# Patient Record
Sex: Male | Born: 1970 | Race: White | Hispanic: No | Marital: Single | Smoking: Current every day smoker
Health system: Southern US, Community
[De-identification: ages and names within clinical notes are randomized; demographics above are authoritative.]

## PROBLEM LIST (undated history)

## (undated) DIAGNOSIS — K219 Gastro-esophageal reflux disease without esophagitis: Secondary | ICD-10-CM

## (undated) DIAGNOSIS — F1910 Other psychoactive substance abuse, uncomplicated: Secondary | ICD-10-CM

## (undated) DIAGNOSIS — F1010 Alcohol abuse, uncomplicated: Secondary | ICD-10-CM

## (undated) DIAGNOSIS — F419 Anxiety disorder, unspecified: Secondary | ICD-10-CM

## (undated) HISTORY — DX: Other psychoactive substance abuse, uncomplicated: F19.10

## (undated) HISTORY — DX: Alcohol abuse, uncomplicated: F10.10

## (undated) HISTORY — DX: Anxiety disorder, unspecified: F41.9

---

## 2001-03-02 ENCOUNTER — Emergency Department (HOSPITAL_COMMUNITY): Admission: EM | Admit: 2001-03-02 | Discharge: 2001-03-02 | Payer: Self-pay | Admitting: Emergency Medicine

## 2001-03-02 ENCOUNTER — Encounter: Payer: Self-pay | Admitting: Emergency Medicine

## 2002-02-16 ENCOUNTER — Emergency Department (HOSPITAL_COMMUNITY): Admission: EM | Admit: 2002-02-16 | Discharge: 2002-02-16 | Payer: Self-pay | Admitting: Emergency Medicine

## 2003-09-15 ENCOUNTER — Emergency Department (HOSPITAL_COMMUNITY): Admission: EM | Admit: 2003-09-15 | Discharge: 2003-09-15 | Payer: Self-pay | Admitting: Emergency Medicine

## 2004-08-17 ENCOUNTER — Emergency Department (HOSPITAL_COMMUNITY): Admission: EM | Admit: 2004-08-17 | Discharge: 2004-08-17 | Payer: Self-pay | Admitting: Emergency Medicine

## 2005-03-22 ENCOUNTER — Emergency Department (HOSPITAL_COMMUNITY): Admission: EM | Admit: 2005-03-22 | Discharge: 2005-03-22 | Payer: Self-pay | Admitting: Emergency Medicine

## 2005-09-21 ENCOUNTER — Emergency Department (HOSPITAL_COMMUNITY): Admission: EM | Admit: 2005-09-21 | Discharge: 2005-09-21 | Payer: Self-pay | Admitting: *Deleted

## 2006-10-29 HISTORY — PX: MULTIPLE TOOTH EXTRACTIONS: SHX2053

## 2009-03-03 ENCOUNTER — Ambulatory Visit: Payer: Self-pay | Admitting: Internal Medicine

## 2009-03-18 ENCOUNTER — Ambulatory Visit: Payer: Self-pay | Admitting: Internal Medicine

## 2010-11-20 ENCOUNTER — Encounter: Payer: Self-pay | Admitting: Internal Medicine

## 2011-05-11 ENCOUNTER — Encounter: Payer: Self-pay | Admitting: Internal Medicine

## 2016-05-31 ENCOUNTER — Emergency Department (HOSPITAL_COMMUNITY)
Admission: EM | Admit: 2016-05-31 | Discharge: 2016-05-31 | Disposition: A | Discharge status: Home / Self Care | Payer: Self-pay | Attending: Emergency Medicine | Admitting: Emergency Medicine

## 2016-05-31 ENCOUNTER — Telehealth: Payer: Self-pay | Admitting: General Practice

## 2016-05-31 ENCOUNTER — Encounter (HOSPITAL_COMMUNITY): Payer: Self-pay | Admitting: Emergency Medicine

## 2016-05-31 DIAGNOSIS — M549 Dorsalgia, unspecified: Secondary | ICD-10-CM | POA: Insufficient documentation

## 2016-05-31 DIAGNOSIS — R1013 Epigastric pain: Secondary | ICD-10-CM | POA: Insufficient documentation

## 2016-05-31 LAB — CBC
HCT: 39.2 % (ref 39.0–52.0)
HEMOGLOBIN: 13.2 g/dL (ref 13.0–17.0)
MCH: 29.5 pg (ref 26.0–34.0)
MCHC: 33.7 g/dL (ref 30.0–36.0)
MCV: 87.7 fL (ref 78.0–100.0)
PLATELETS: 265 10*3/uL (ref 150–400)
RBC: 4.47 MIL/uL (ref 4.22–5.81)
RDW: 14 % (ref 11.5–15.5)
WBC: 10.2 10*3/uL (ref 4.0–10.5)

## 2016-05-31 LAB — COMPREHENSIVE METABOLIC PANEL
ALK PHOS: 47 U/L (ref 38–126)
ALT: 21 U/L (ref 17–63)
ANION GAP: 8 (ref 5–15)
AST: 26 U/L (ref 15–41)
Albumin: 4.5 g/dL (ref 3.5–5.0)
BUN: 8 mg/dL (ref 6–20)
CALCIUM: 9.2 mg/dL (ref 8.9–10.3)
CO2: 27 mmol/L (ref 22–32)
CREATININE: 1.09 mg/dL (ref 0.61–1.24)
Chloride: 103 mmol/L (ref 101–111)
Glucose, Bld: 181 mg/dL — ABNORMAL HIGH (ref 65–99)
Potassium: 4.3 mmol/L (ref 3.5–5.1)
SODIUM: 138 mmol/L (ref 135–145)
TOTAL PROTEIN: 7.7 g/dL (ref 6.5–8.1)
Total Bilirubin: 0.4 mg/dL (ref 0.3–1.2)

## 2016-05-31 LAB — LIPASE, BLOOD: Lipase: 18 U/L (ref 11–51)

## 2016-05-31 LAB — URINALYSIS, ROUTINE W REFLEX MICROSCOPIC
BILIRUBIN URINE: NEGATIVE
Glucose, UA: NEGATIVE mg/dL
HGB URINE DIPSTICK: NEGATIVE
KETONES UR: NEGATIVE mg/dL
Leukocytes, UA: NEGATIVE
NITRITE: NEGATIVE
PH: 7 (ref 5.0–8.0)
Protein, ur: NEGATIVE mg/dL
SPECIFIC GRAVITY, URINE: 1.015 (ref 1.005–1.030)

## 2016-05-31 LAB — ETHANOL: Alcohol, Ethyl (B): <5 mg/dL (ref <5)

## 2016-05-31 MED ORDER — OMEPRAZOLE 20 MG PO CPDR: 20.0000 mg | DELAYED_RELEASE_CAPSULE | Freq: Every day | ORAL | 0 refills | Status: DC

## 2016-05-31 MED ORDER — SODIUM CHLORIDE 0.9 % IV BOLUS (SEPSIS)
1000.0000 mL | Freq: Once | INTRAVENOUS | Status: AC
  Administered 2016-05-31: 1000 mL via INTRAVENOUS

## 2016-05-31 MED ORDER — SUCRALFATE 1 GM/10ML PO SUSP
1.0000 g | Freq: Once | ORAL | Status: AC
  Administered 2016-05-31: 1 g via ORAL
  Filled 2016-05-31: qty 10

## 2016-05-31 MED ORDER — SUCRALFATE 1 GM/10ML PO SUSP: 1.0000 g | Freq: Three times a day (TID) | ORAL | 0 refills | Status: DC

## 2016-05-31 MED ORDER — FAMOTIDINE IN NACL 20-0.9 MG/50ML-% IV SOLN
20.0000 mg | Freq: Once | INTRAVENOUS | Status: AC
  Administered 2016-05-31: 20 mg via INTRAVENOUS
  Filled 2016-05-31: qty 50

## 2016-05-31 MED ORDER — GI COCKTAIL ~~LOC~~
30.0000 mL | Freq: Once | ORAL | Status: AC
  Administered 2016-05-31: 30 mL via ORAL
  Filled 2016-05-31: qty 30

## 2016-05-31 NOTE — Telephone Encounter (Signed)
Patient was seen in the ED for epigastric pain and was told by the ED doctor to make an appointment here to see a gastroenterologist for possible upper endoscopy  He is self-pay and will be paying cash for his visits

## 2016-05-31 NOTE — Discharge Instructions (Signed)
As discussed, and all those today's appointment has been reassuring, it is important that you follow up with our gastroenterologist.  Return here for concerning changes in your condition.

## 2016-05-31 NOTE — ED Provider Notes (Signed)
[facility]EM[facility] DEPT Provider Note   CSN: [id number] Arrival date & time: 05/31/16  0901  First Provider Contact:   First MD Initiated Contact with Patient 05/31/16 0908     By signing my name below, I, Majel Homer, attest that this documentation has been prepared under the direction and in the presence of Gerhard Munch, MD . Electronically Signed: Majel Homer, Scribe. 05/31/2016. 9:23 AM.  History   Chief Complaint Chief Complaint  Patient presents with  . Abdominal Pain   The history is provided by the patient. No language interpreter was used.   HPI Comments: Bradley Waller is a 45 y.o. male with PMHx of drug and alcogol abuse, who presents to the Emergency Department complaining of gradually worsening, 9/10, "sharp," mid-abdominal pain that began at ~2:00 AM this morning. He notes his pain is radiating towards the bilateral sides of his abdomen and into his lower back. He states he took Maalox and Zantac this morning with no relief. Pt reports a hx of gastric ulcers ~2 years ago but notes he has not followed-up with a gastroenterologist since then. He denies nausea, vomiting, dysuria and diarrhea. Pt states he smokes 1 pack a day.   Past Medical History:  Diagnosis Date  . Alcohol abuse   . Anxiety   . Drug abuse    There are no active problems to display for this patient.  No past surgical history on file.  Home Medications    Prior to Admission medications   Not on File   Family History No family history on file.  Social History Social History  Substance Use Topics  . Smoking status: Not on file  . Smokeless tobacco: Not on file  . Alcohol use Not on file   Allergies   Review of patient's allergies indicates no known allergies.   Review of Systems Review of Systems  Gastrointestinal: Positive for abdominal pain. Negative for diarrhea, nausea and vomiting.  Genitourinary: Negative for dysuria.  Musculoskeletal: Positive for back pain.  All other systems  reviewed and are negative.  Physical Exam Updated Vital Signs BP (!) 131/107 (BP Location: Right Arm)   Pulse 74   Temp 97.5 F (36.4 C) (Oral)   Resp 18   Ht 5\' 11"  (1.803 m)   Wt 205 lb (93 kg)   SpO2 100%   BMI 28.59 kg/m   Physical Exam  Constitutional: He is oriented to person, place, and time. He appears well-developed and well-nourished.  HENT:  Head: Normocephalic and atraumatic.  Eyes: Conjunctivae are normal. Pupils are equal, round, and reactive to light. Right eye exhibits no discharge. Left eye exhibits no discharge. No scleral icterus.  Neck: Normal range of motion. No JVD present. No tracheal deviation present.  Pulmonary/Chest: Effort normal. No stridor.  Neurological: He is alert and oriented to person, place, and time. Coordination normal.  Psychiatric: He has a normal mood and affect. His behavior is normal. Judgment and thought content normal.  Nursing note and vitals reviewed.  ED Treatments / Results  Labs (all labs ordered are listed, but only abnormal results are displayed) Labs Reviewed  COMPREHENSIVE METABOLIC PANEL - Abnormal; Notable for the following:       Result Value   Glucose, Bld 181 (*)    All other components within normal limits  LIPASE, BLOOD  CBC  URINALYSIS, ROUTINE W REFLEX MICROSCOPIC (NOT AT [facility])  ETHANOL     Procedures Procedures  DIAGNOSTIC STUDIES:  Oxygen Saturation is 100% on RA, normal by  my interpretation.    COORDINATION OF CARE:  9:20 AM Discussed treatment plan with pt at bedside and pt agreed to plan.   Medications Ordered in ED  GI cocktail, fluids, Carafate  Initial Impression / Assessment and Plan / ED Course  I have reviewed the triage vital signs and the nursing notes.  Pertinent labs & imaging results that were available during my care of the patient were reviewed by me and considered in my medical decision making (see chart for details).  Clinical Course  Comment By Time  Minimal  improvement. Initial labs reviewed Gerhard Munch, MD 08/03 1045    11:44 AM Symptoms have resolved. I discussed all findings patient, and with his resolution of symptoms, he was discharged in stable condition to follow-up with GI.  I personally performed the services described in this documentation, which was scribed in my presence. The recorded information has been reviewed and is accurate.   Final Clinical Impressions(s) / ED Diagnoses  Patient presents with ongoing abdominal pain, nausea. Patient has history of similar pain for some time, and today seems to be experiencing an exacerbation. Here, no evidence for acute pancreatitis, hepatobiliary dysfunction. Patient required fluid resuscitation, multiple attempts at medication relief, though his symptoms did resolve eventually. Patient encouraged to take all medication as directed, follow-up with gastroenterology, for possible endoscopy.   Gerhard Munch, MD 05/31/16 1146

## 2016-05-31 NOTE — Telephone Encounter (Signed)
Patient is scheduled to see Bradley Waller tomorrow at 8 am

## 2016-05-31 NOTE — ED Triage Notes (Signed)
Pt c/o generalized abdominal pain that began around 0200 this morning. Reports hx of stomach ulcer. States he took Maalox and Zantac with no relief. Pt also c/o urinary rentention and back pain. No v/d.

## 2016-06-01 ENCOUNTER — Encounter: Payer: Self-pay | Admitting: Gastroenterology

## 2016-06-01 ENCOUNTER — Other Ambulatory Visit: Payer: Self-pay

## 2016-06-01 ENCOUNTER — Ambulatory Visit (INDEPENDENT_AMBULATORY_CARE_PROVIDER_SITE_OTHER): Payer: Self-pay | Admitting: Gastroenterology

## 2016-06-01 DIAGNOSIS — R1013 Epigastric pain: Secondary | ICD-10-CM | POA: Insufficient documentation

## 2016-06-01 NOTE — Progress Notes (Signed)
No pcp per patient 

## 2016-06-01 NOTE — Assessment & Plan Note (Signed)
45 year old male with history of chronic episodic epigastric pain but now worsening and more frequent, persistent, in setting of chronic NSAIDs. Unable to exclude biliary etiology but less likely. Lipase, LFTs, CBC normal. No imaging. Abdominal exam benign. Will start with EGD first; may need further imaging in near future.   Proceed with upper endoscopy in the near future with Dr. Darrick Penna. The risks, benefits, and alternatives have been discussed in detail with patient. They have stated understanding and desire to proceed.  Phenergan 25 mg IV on call Dexilant samples provided with patient assistance forms Fill out [facility] assistance forms. Discussed with patient that he would need to fill this out; also discussed that if this did not cover, he would be responsible for completing a financial plan for payment. He states full understanding.

## 2016-06-01 NOTE — Progress Notes (Addendum)
REVIEWED-NO ADDITIONAL RECOMMENDATIONS.  Primary Care Physician:  No PCP Per Patient Primary Gastroenterologist:  Dr. Darrick Penna   Chief Complaint  Patient presents with  . Abdominal Pain    x 1-2 years    HPI:   Bradley Waller is a 45 y.o. male presenting today as a self-referral secondary to worsening dyspepsia.   Epigastric pain, radiates to LUQ, occasionally over to RUQ. Pretty sure he has an ulcer. States he wouldn't be surprised if he had an ulcer. No prior evaluation for this. First episode of pain 2-3 years ago and lasted about 2-3 hours. No problems for 6-8 months then happened again. Then another 8-9 months passed. This week has been nightly in occurrence, happening around 12pm, 1 am, "ridiculous" pain. Exacerbated by food but feels sore all the time. Described as sharp or stabbing, radiates into back. Lipase normal in the ED. LFTs normal. CBC normal. Gallbladder present. No imaging in the ED. Takes Ibuprofen routinely, taking due to headaches. No aspirin powders. No melena or hematochezia. No loss of appetite. No dysphagia. Started Omeprazole on 8/3. Prescribed Carafate on 8/3 as well. No current ETOH use. History of ETOH use but none in 5-6 years. Was on prescription Valium for history of anxiety but has not been on any for about 1 year (this was legally prescribed by a provider).   Past Medical History:  Diagnosis Date  . Alcohol abuse   . Anxiety   . Drug abuse   . Stomach ulcer     No past surgical history on file.  Current Outpatient Prescriptions  Medication Sig Dispense Refill  . acetaminophen (TYLENOL) 500 MG tablet Take 1,000 mg by mouth every 6 (six) hours as needed for moderate pain.    Marland Kitchen omeprazole (PRILOSEC) 20 MG capsule Take 1 capsule (20 mg total) by mouth daily. 21 capsule 0  . sucralfate (CARAFATE) 1 GM/10ML suspension Take 10 mLs (1 g total) by mouth 4 (four) times daily -  with meals and at bedtime. 420 mL 0   No current facility-administered  medications for this visit.     Allergies as of 06/01/2016  . (No Known Allergies)    Family History  Problem Relation Age of Onset  . Colon cancer Neg Hx     Social History   Social History  . Marital status: Single    Spouse name: N/A  . Number of children: N/A  . Years of education: N/A   Occupational History  . Not on file.   Social History Main Topics  . Smoking status: Current Every Day Smoker    Packs/day: 1.00    Types: Cigarettes  . Smokeless tobacco: Never Used  . Alcohol use No     Comment: history of ETOH abuse but none in 5-6 years.   . Drug use: No     Comment: was on prescription Valium, off for about 1 year.   . Sexual activity: Not on file   Other Topics Concern  . Not on file   Social History Narrative  . No narrative on file    Review of Systems: Gen: Denies any fever, chills, fatigue, weight loss, lack of appetite.  CV: Denies chest pain, heart palpitations, peripheral edema, syncope.  Resp: +SOB  GI: see HPI  GU : Denies urinary burning, urinary frequency, urinary hesitancy MS: Denies joint pain, muscle weakness, cramps, or limitation of movement.  Derm: Denies rash, itching, dry skin Psych: +anxiety  Heme: Denies bruising, bleeding, and enlarged  lymph nodes.  Physical Exam: BP (!) 158/102   Pulse 89   Temp 97.4 F (36.3 C) (Oral)   Ht  (1.803 m)   Wt 192 lb 12.8 oz (87.5 kg)   BMI 26.89 kg/m  General:   Alert and oriented. Pleasant and cooperative. Well-nourished and well-developed.  Head:  Normocephalic and atraumatic. Eyes:  Without icterus, sclera clear and conjunctiva pink.  Ears:  Normal auditory acuity. Nose:  No deformity, discharge,  or lesions. Lungs:  Clear to auscultation bilaterally. No wheezes, rales, or rhonchi. No distress.  Heart:  S1, S2 present without murmurs appreciated.  Abdomen:  +BS, soft, mild to moderate TTP epigastric region and non-distended. No HSM noted. No guarding or rebound. No masses  appreciated.  Rectal:  Deferred  Extremities:  Without  edema. Neurologic:  Alert and  oriented x4;  grossly normal neurologically. Psych:  Alert and cooperative. Normal mood and affect.  Lab Results  Component Value Date   ALT 21 05/31/2016   AST 26 05/31/2016   ALKPHOS 47 05/31/2016   BILITOT 0.4 05/31/2016   Lab Results  Component Value Date   WBC 10.2 05/31/2016   HGB 13.2 05/31/2016   HCT 39.2 05/31/2016   MCV 87.7 05/31/2016   PLT 265 05/31/2016   Lab Results  Component Value Date   CREATININE 1.09 05/31/2016   BUN 8 05/31/2016   NA 138 05/31/2016   K 4.3 05/31/2016   CL 103 05/31/2016   CO2 27 05/31/2016   Lab Results  Component Value Date   LIPASE 18 05/31/2016

## 2016-06-01 NOTE — Patient Instructions (Signed)
Stop Prilosec. Start taking Dexilant once each morning. I have provided samples and patient assistance forms to fill out.   We have scheduled you for an upper endoscopy with Dr. Darrick Penna.   Please fill out the [facility] Assistance paperwork so that this will hopefully be covered. If not, this could be self pay.   If any worsening of symptoms, go to the emergency room.

## 2016-06-08 ENCOUNTER — Encounter (HOSPITAL_COMMUNITY): Admission: RE | Payer: Self-pay | Source: Ambulatory Visit

## 2016-06-08 ENCOUNTER — Ambulatory Visit (HOSPITAL_COMMUNITY): Admission: RE | Admit: 2016-06-08 | Payer: Self-pay | Source: Ambulatory Visit | Admitting: Gastroenterology

## 2016-06-08 SURGERY — EGD (ESOPHAGOGASTRODUODENOSCOPY)
Anesthesia: Moderate Sedation

## 2016-09-05 ENCOUNTER — Encounter: Payer: Self-pay | Admitting: Gastroenterology

## 2016-09-05 ENCOUNTER — Ambulatory Visit (INDEPENDENT_AMBULATORY_CARE_PROVIDER_SITE_OTHER): Payer: Self-pay | Admitting: Gastroenterology

## 2016-09-05 VITALS — BP 147/106 | HR 102 | Temp 98.1°F | Ht 71.0 in | Wt 185.0 lb

## 2016-09-05 DIAGNOSIS — R197 Diarrhea, unspecified: Secondary | ICD-10-CM

## 2016-09-05 DIAGNOSIS — R1010 Upper abdominal pain, unspecified: Secondary | ICD-10-CM

## 2016-09-05 MED ORDER — SUCRALFATE 1 GM/10ML PO SUSP: 1.0000 g | Freq: Three times a day (TID) | ORAL | 0 refills | Status: DC

## 2016-09-05 MED ORDER — PANTOPRAZOLE SODIUM 40 MG PO TBEC: 40.0000 mg | DELAYED_RELEASE_TABLET | Freq: Every day | ORAL | 3 refills | Status: DC

## 2016-09-05 NOTE — Patient Instructions (Signed)
I have sent in Protonix to take once each day, 30 minutes before breakfast. DON'T START THIS until after you go to lab and do the blood and breath test.  Please complete the stool samples and return to the lab when you go.   We have also ordered a CT scan to get done this week.   Please finish the [facility] Assistance paperwork as soon as you can.

## 2016-09-05 NOTE — Progress Notes (Addendum)
REVIEWED-NO ADDITIONAL RECOMMENDATIONS.  Referring Provider: No ref. provider found Primary Care Physician:  No PCP Per Patient  Primary GI: Dr. Darrick PennaFields    Chief Complaint  Patient presents with  . Abdominal Pain    pain in mid upper abd, cramping RUQ  . Diarrhea    watery, x 1 month, up to 5x/day    HPI:   Bradley Waller is a 45 y.o. male presenting today with a history of chronic abdominal pain and now with new onset diarrhea. Was seen in Aug 2017 and recommended EGD but this was cancelled as he started feeling better.   Has had diarrhea for about a month now. 3-4 times per day, sometimes up to 5. Watery. No recent antibiotics, no sick contacts, has well water. Pain located in epigastric region, waxes and wanes. Usually epigastric pain starts, will go away, and then RUQ discomfort will start.  Worst is located in epigastric. States he toughs it out, lasting about 12 hours. Pain is worsened with eating. Pain unrelated to loose stool. No NSAIDs. No PPI. No dysphagia. Just taking a probiotic. No rectal bleeding. Prior to this happening would have a BM once every 3 days. Notes a lot of heartburn. Occasional nausea, no vomiting.   Lost 7 lbs since visit in Aug 2017. Highest weight was 210 about a year ago. Labs in August at time of abdominal pain with normal lipase, LFTs, CBC.    Past Medical History:  Diagnosis Date  . Alcohol abuse   . Anxiety   . Drug abuse     No past surgical history on file.  Current Outpatient Prescriptions  Medication Sig Dispense Refill  . Probiotic Product (PROBIOTIC DAILY PO) Take by mouth daily. Accuflora once a day    . sucralfate (CARAFATE) 1 GM/10ML suspension Take 10 mLs (1 g total) by mouth 4 (four) times daily -  with meals and at bedtime. (Patient not taking: Reported on 09/05/2016) 420 mL 0   No current facility-administered medications for this visit.     Allergies as of 09/05/2016  . (No Known Allergies)    Family History  Problem  Relation Age of Onset  . Colon cancer Neg Hx     Social History   Social History  . Marital status: Single    Spouse name: N/A  . Number of children: N/A  . Years of education: N/A   Social History Main Topics  . Smoking status: Current Every Day Smoker    Packs/day: 1.00    Types: Cigarettes  . Smokeless tobacco: Never Used  . Alcohol use No     Comment: history of ETOH abuse but none in 5-6 years.   . Drug use: No     Comment: was on prescription Valium, off for about 1 year.   . Sexual activity: Not Asked   Other Topics Concern  . None   Social History Narrative  . None    Review of Systems: As mentioned in HPI   Physical Exam: BP (!) 147/106   Pulse (!) 102   Temp 98.1 F (36.7 C) (Oral)   Ht 5\' 11"  (1.803 m)   Wt 185 lb (83.9 kg)   BMI 25.80 kg/m  General:   Alert and oriented. No distress noted. Pleasant and cooperative.  Head:  Normocephalic and atraumatic. Eyes:  Conjuctiva clear without scleral icterus. Mouth:  Oral mucosa pink and moist. Good dentition. No lesions. Neck:  Supple, without mass or thyromegaly. Heart:  S1, S2  present without murmurs Abdomen:  +BS, soft, TTP epigastric and RUQ  and non-distended. No rebound or guarding. No HSM or masses noted. Msk:  Symmetrical without gross deformities. Normal posture. Extremities:  Without edema. Neurologic:  Alert and  oriented x4;  grossly normal neurologically. Psych:  Alert and cooperative. Normal mood and affect.

## 2016-09-06 ENCOUNTER — Other Ambulatory Visit (HOSPITAL_COMMUNITY)
Admission: RE | Admit: 2016-09-06 | Discharge: 2016-09-06 | Disposition: A | Discharge status: Home / Self Care | Payer: Self-pay | Source: Ambulatory Visit | Attending: Gastroenterology | Admitting: Gastroenterology

## 2016-09-06 ENCOUNTER — Ambulatory Visit (HOSPITAL_COMMUNITY)
Admission: RE | Admit: 2016-09-06 | Discharge: 2016-09-06 | Disposition: A | Discharge status: Home / Self Care | Payer: Self-pay | Source: Ambulatory Visit | Attending: Gastroenterology | Admitting: Gastroenterology

## 2016-09-06 ENCOUNTER — Encounter (HOSPITAL_COMMUNITY): Payer: Self-pay

## 2016-09-06 DIAGNOSIS — R1010 Upper abdominal pain, unspecified: Secondary | ICD-10-CM

## 2016-09-06 DIAGNOSIS — R197 Diarrhea, unspecified: Secondary | ICD-10-CM | POA: Insufficient documentation

## 2016-09-06 LAB — BASIC METABOLIC PANEL
Anion gap: 6 (ref 5–15)
BUN: 6 mg/dL (ref 6–20)
CALCIUM: 9.5 mg/dL (ref 8.9–10.3)
CHLORIDE: 103 mmol/L (ref 101–111)
CO2: 28 mmol/L (ref 22–32)
CREATININE: 0.99 mg/dL (ref 0.61–1.24)
GFR calc Af Amer: >60 mL/min (ref >60)
GFR calc non Af Amer: >60 mL/min (ref >60)
GLUCOSE: 89 mg/dL (ref 65–99)
Potassium: 4.7 mmol/L (ref 3.5–5.1)
Sodium: 137 mmol/L (ref 135–145)

## 2016-09-06 LAB — HEPATIC FUNCTION PANEL
ALT: 23 U/L (ref 17–63)
AST: 23 U/L (ref 15–41)
Albumin: 4.3 g/dL (ref 3.5–5.0)
Alkaline Phosphatase: 53 U/L (ref 38–126)
BILIRUBIN DIRECT: 0.1 mg/dL (ref 0.1–0.5)
BILIRUBIN INDIRECT: 0.3 mg/dL (ref 0.3–0.9)
TOTAL PROTEIN: 7.3 g/dL (ref 6.5–8.1)
Total Bilirubin: 0.4 mg/dL (ref 0.3–1.2)

## 2016-09-06 LAB — C DIFFICILE QUICK SCREEN W PCR REFLEX
C Diff antigen: NEGATIVE
C Diff interpretation: NOT DETECTED
C Diff toxin: NEGATIVE

## 2016-09-06 LAB — LIPASE, BLOOD: Lipase: 21 U/L (ref 11–51)

## 2016-09-06 MED ORDER — BARIUM SULFATE 2.1 % PO SUSP
ORAL | Status: AC
  Filled 2016-09-06: qty 2

## 2016-09-07 ENCOUNTER — Ambulatory Visit (HOSPITAL_COMMUNITY)
Admission: RE | Admit: 2016-09-07 | Discharge: 2016-09-07 | Disposition: A | Discharge status: Home / Self Care | Payer: Self-pay | Source: Ambulatory Visit | Attending: Gastroenterology | Admitting: Gastroenterology

## 2016-09-07 ENCOUNTER — Telehealth: Payer: Self-pay

## 2016-09-07 DIAGNOSIS — R1010 Upper abdominal pain, unspecified: Secondary | ICD-10-CM | POA: Insufficient documentation

## 2016-09-07 MED ORDER — IOPAMIDOL (ISOVUE-300) INJECTION 61%
100.0000 mL | Freq: Once | INTRAVENOUS | Status: AC | PRN
  Administered 2016-09-07: 100 mL via INTRAVENOUS

## 2016-09-07 NOTE — Telephone Encounter (Signed)
[facility]- pt came by there yesterday and had blood work done and turned in stool studies. They did not do the hpylori breath test, she said they do not do that test at the hospital lab. She was just calling to let us know.  Routing to [employer] and Anna.

## 2016-09-10 DIAGNOSIS — R197 Diarrhea, unspecified: Secondary | ICD-10-CM | POA: Insufficient documentation

## 2016-09-10 DIAGNOSIS — R1010 Upper abdominal pain, unspecified: Secondary | ICD-10-CM | POA: Insufficient documentation

## 2016-09-10 MED ORDER — DICYCLOMINE HCL 10 MG PO CAPS: 10.0000 mg | ORAL_CAPSULE | Freq: Three times a day (TID) | ORAL | 3 refills | Status: DC

## 2016-09-10 NOTE — Assessment & Plan Note (Signed)
New onset, unclear etiology. Does not appear to have any contact with sick individuals or exposure to antibiotics. Check stool studies now. Will send in Bentyl if negative. No rectal bleeding or indication for colonoscopy just yet. Will closely monitor.

## 2016-09-10 NOTE — Assessment & Plan Note (Signed)
45 year old male with chronic upper abdominal pain/RUQ pain, waxing and waning, without dysphagia but associated nausea. Worsened by eating. Weight loss noted of 7 lbs since visit in August. No PPI. Proceed with labs, H.pylori breath test, CT abd/pelvis. Start Protonix once daily after breath test completed. May need EGD. Gallbladder remains in situ; unable to exclude biliary etiology.

## 2016-09-10 NOTE — Progress Notes (Signed)
CT shows mild wall thickening of gallbladder. LFTs, CBC normal. Stool studies negative for Cdiff. I will send in Bentyl to take before meals and at bedtime. HE NEEDS an ultrasound of his gallbladder.

## 2016-09-10 NOTE — Telephone Encounter (Signed)
Can we see if patient has started on his PPI yet? If he has, will need to postpone H.pylori breath test. If not, [facility] to get completed. Also, I am follow-up on results that he had done. Will send a result note.

## 2016-09-11 ENCOUNTER — Other Ambulatory Visit: Payer: Self-pay

## 2016-09-11 DIAGNOSIS — R935 Abnormal findings on diagnostic imaging of other abdominal regions, including retroperitoneum: Secondary | ICD-10-CM

## 2016-09-11 DIAGNOSIS — R1010 Upper abdominal pain, unspecified: Secondary | ICD-10-CM

## 2016-09-11 LAB — STOOL CULTURE REFLEX - RSASHR

## 2016-09-11 LAB — STOOL CULTURE: E coli, Shiga toxin Assay: NEGATIVE

## 2016-09-11 LAB — STOOL CULTURE REFLEX - CMPCXR

## 2016-09-11 NOTE — Progress Notes (Signed)
LMOM to call.

## 2016-09-11 NOTE — Telephone Encounter (Signed)
LMOM to call.

## 2016-09-11 NOTE — Progress Notes (Signed)
Pt is aware of results and OK to schedule the US of gallbladder.

## 2016-09-11 NOTE — Progress Notes (Signed)
NO PCP PER PATIENT °

## 2016-09-13 ENCOUNTER — Ambulatory Visit (HOSPITAL_COMMUNITY): Admission: RE | Admit: 2016-09-13 | Payer: Self-pay | Source: Ambulatory Visit

## 2016-09-14 ENCOUNTER — Telehealth: Payer: Self-pay | Admitting: Gastroenterology

## 2016-09-14 ENCOUNTER — Ambulatory Visit (HOSPITAL_COMMUNITY)
Admission: RE | Admit: 2016-09-14 | Discharge: 2016-09-14 | Disposition: A | Discharge status: Home / Self Care | Payer: Self-pay | Source: Ambulatory Visit | Attending: Gastroenterology | Admitting: Gastroenterology

## 2016-09-14 DIAGNOSIS — R1010 Upper abdominal pain, unspecified: Secondary | ICD-10-CM | POA: Insufficient documentation

## 2016-09-14 DIAGNOSIS — K760 Fatty (change of) liver, not elsewhere classified: Secondary | ICD-10-CM | POA: Insufficient documentation

## 2016-09-14 DIAGNOSIS — R935 Abnormal findings on diagnostic imaging of other abdominal regions, including retroperitoneum: Secondary | ICD-10-CM | POA: Insufficient documentation

## 2016-09-14 LAB — GIARDIA/CRYPTOSPORIDIUM EIA
Cryptosporidium EIA: NEGATIVE
Giardia Ag, Stl: NEGATIVE

## 2016-09-14 NOTE — Telephone Encounter (Signed)
ATTEMPTED MULTIPLE TIMES TO CALL PT. PHONE NUMBER IS BUSY.

## 2016-09-14 NOTE — Telephone Encounter (Signed)
AGAIN ATTEMPTED TO CONTACT PT. PHONE AGAIN BUSY. SEND LETTER FOR PT TO CONTACT OFFICE FOR RESULTS.

## 2016-09-14 NOTE — Telephone Encounter (Signed)
Called BY RADIOLOGY US C/W CHOLECYSTITIS. DISCUSSED WITH DR. DAVIS. PLAN FOR SURGERY WITHIN THE NEXT 7-10 DAYS. WILL SEE PT IN [facility] AND SCHEDULE CHOLECYSTECTOMY.

## 2016-09-17 NOTE — Telephone Encounter (Signed)
Letter was mailed to pt this morning to call in reference to his US results.

## 2016-09-17 NOTE — Telephone Encounter (Signed)
Letter mailed

## 2016-09-19 NOTE — Telephone Encounter (Signed)
I tried to reach patient again. His aunt answered the phone. The number listed is his aunt. I explained to her that he needed to call our office as soon as he can, but that we will be closed Thursday and Friday. I asked her to have him call us first thing on Monday.   We need to get him an appt with Dr. Earlene Plateravis for evaluation next week. The number his aunt gave me was [phone]. I don't know if this is him, because I called and it was only a generic answering machine message.   Let's try to get up with him again first thing on Monday morning.

## 2016-09-24 ENCOUNTER — Telehealth: Payer: Self-pay | Admitting: Gastroenterology

## 2016-09-24 ENCOUNTER — Other Ambulatory Visit: Payer: Self-pay

## 2016-09-24 DIAGNOSIS — K8021 Calculus of gallbladder without cholecystitis with obstruction: Secondary | ICD-10-CM

## 2016-09-24 NOTE — Telephone Encounter (Signed)
Spoke to pt today about the referral to surgeon. He has started back on Protonix. He is aware he will need to be off of it for 14 days prior to doing the H Pylori test. He is aware [facility].  He said he wants to wait on it for awhile til after he sees the surgeon.

## 2016-09-24 NOTE — Telephone Encounter (Signed)
I called and told pt would be at this number and then she gave me two numbers, she couldn't read the number good. I called both [phone] and [phone] and left message with my name and where I was calling from. Told them to disregard if it was not Bradley Waller.

## 2016-09-24 NOTE — Telephone Encounter (Signed)
Referral has been faxed.

## 2016-09-24 NOTE — Telephone Encounter (Signed)
Ginger, please get this ASAP.

## 2016-09-24 NOTE — Telephone Encounter (Signed)
[phone] PATIENT RETURNED CALL FROM LAST WEEK INQUIRING ABOUT HIS CARE

## 2016-09-24 NOTE — Telephone Encounter (Signed)
I called pt's aunt back [facility]( [place]) and told her it is VERY IMPORTANT for pt to get in touch with us. He could not be reached at the numbers she gave me.  She will try to get in touch with him and have him call.

## 2016-09-24 NOTE — Telephone Encounter (Signed)
See addendum to the OV note of 09/14/2016. Pt is aware and ok to refer to Surgeon.  [facility].

## 2016-09-24 NOTE — Telephone Encounter (Signed)
Pt is aware and OK to refer to Dr. Earlene Plateravis. OK to call his aunt [place] @ [phone] and she will get in touch with him to call.

## 2016-09-24 NOTE — Telephone Encounter (Signed)
I agree, no need to do H.pylori test right now. His symptoms are coming from cholecystitis. He can hold off on that. Does he have an appt with Dr. Earlene Plateravis? If not, we need to get him in ASAP.

## 2016-09-26 NOTE — Patient Instructions (Signed)
Bradley ScoreRichard W Caillier  09/26/2016     @PREFPERIOPPHARMACY @   Your procedure is scheduled on 10/01/2016   [facility]  [facility]  [facility].M.  Call this number if you have problems the morning of surgery:  [phone]   Remember:  Do not eat food or drink liquids after midnight.  Take these medicines the morning of surgery with A SIP OF WATER  Protonix, bentyl.   Do not wear jewelry, make-up or nail polish.  Do not wear lotions, powders, or perfumes, or deoderant.  Do not shave 48 hours prior to surgery.  Men may shave face and neck.  Do not bring valuables to the hospital.  [facility] belongings or valuables.  Contacts, dentures or bridgework may not be worn into surgery.  Leave your suitcase in the car.  After surgery it may be brought to your room.  For patients admitted to the hospital, discharge time will be determined by your treatment team.  Patients discharged the day of surgery will not be allowed to drive home.   Name and phone number of your driver:   family Special instructions:  none  Please read over the following fact sheets that you were given. Anesthesia Post-op Instructions and Care and Recovery After Surgery       Laparoscopic Cholecystectomy Laparoscopic cholecystectomy is surgery to remove the gallbladder. The gallbladder is a pear-shaped organ that lies beneath the liver on the right side of the body. The gallbladder stores bile, which is a fluid that helps the body to digest fats. Cholecystectomy is often done for inflammation of the gallbladder (cholecystitis). This condition is usually caused by a buildup of gallstones (cholelithiasis) in the gallbladder. Gallstones can block the flow of bile, which can result in inflammation and pain. In severe cases, emergency surgery may be required. This procedure is done though small incisions in your abdomen (laparoscopic surgery). A thin scope with a camera  (laparoscope) is inserted through one incision. Thin surgical instruments are inserted through the other incisions. In some cases, a laparoscopic procedure may be turned into a type of surgery that is done through a larger incision (open surgery). Tell a health care provider about:  Any allergies you have.  All medicines you are taking, including vitamins, herbs, eye drops, creams, and over-the-counter medicines.  Any problems you or family members have had with anesthetic medicines.  Any blood disorders you have.  Any surgeries you have had.  Any medical conditions you have.  Whether you are pregnant or may be pregnant. What are the risks? Generally, this is a safe procedure. However, problems may occur, including:  Infection.  Bleeding.  Allergic reactions to medicines.  Damage to other structures or organs.  A stone remaining in the common bile duct. The common bile duct carries bile from the gallbladder into the small intestine.  A bile leak from the cyst duct that is clipped when your gallbladder is removed. What happens before the procedure? Staying hydrated  Follow instructions from your health care provider about hydration, which may include:  Up to 2 hours before the procedure - you may continue to drink clear liquids, such as water, clear fruit juice, black coffee, and plain tea. Eating and drinking restrictions  Follow instructions from your health care provider about eating and drinking, which may include:  8 hours before the procedure - stop eating heavy meals or foods such  as meat, fried foods, or fatty foods.  6 hours before the procedure - stop eating light meals or foods, such as toast or cereal.  6 hours before the procedure - stop drinking milk or drinks that contain milk.  2 hours before the procedure - stop drinking clear liquids. Medicines  Ask your health care provider about:  Changing or stopping your regular medicines. This is especially  important if you are taking diabetes medicines or blood thinners.  Taking medicines such as aspirin and ibuprofen. These medicines can thin your blood. Do not take these medicines before your procedure if your health care provider instructs you not to.  You may be given antibiotic medicine to help prevent infection. General instructions  Let your health care provider know if you develop a cold or an infection before surgery.  Plan to have someone take you home from the hospital or clinic.  Ask your health care provider how your surgical site will be marked or identified. What happens during the procedure?  To reduce your risk of infection:  Your health care team will wash or sanitize their hands.  Your skin will be washed with soap.  Hair may be removed from the surgical area.  An IV tube may be inserted into one of your veins.  You will be given one or more of the following:  A medicine to help you relax (sedative).  A medicine to make you fall asleep (general anesthetic).  A breathing tube will be placed in your mouth.  Your surgeon will make several small cuts (incisions) in your abdomen.  The laparoscope will be inserted through one of the small incisions. The camera on the laparoscope will send images to a TV screen (monitor) in the operating room. This lets your surgeon see inside your abdomen.  Air-like gas will be pumped into your abdomen. This will expand your abdomen to give the surgeon more room to perform the surgery.  Other tools that are needed for the procedure will be inserted through the other incisions. The gallbladder will be removed through one of the incisions.  Your common bile duct may be examined. If stones are found in the common bile duct, they may be removed.  After your gallbladder has been removed, the incisions will be closed with stitches (sutures), staples, or skin glue.  Your incisions may be covered with a bandage (dressing). The  procedure may vary among health care providers and hospitals. What happens after the procedure?  Your blood pressure, heart rate, breathing rate, and blood oxygen level will be monitored until the medicines you were given have worn off.  You will be given medicines as needed to control your pain.  Do not drive for 24 hours if you were given a sedative. This information is not intended to replace advice given to you by your health care provider. Make sure you discuss any questions you have with your health care provider. Document Released: 10/15/2005 Document Revised: 05/06/2016 Document Reviewed: 04/02/2016 Elsevier Interactive Patient Education  2017 [employer]  Laparoscopic Cholecystectomy, Care After This sheet gives you information about how to care for yourself after your procedure. Your doctor may also give you more specific instructions. If you have problems or questions, contact your doctor. Follow these instructions at home: Care for cuts from surgery (incisions)   Follow instructions from your doctor about how to take care of your cuts from surgery. Make sure you:  Wash your hands with soap and water before you change your  bandage (dressing). If you cannot use soap and water, use hand sanitizer.  Change your bandage as told by your doctor.  Leave stitches (sutures), skin glue, or skin tape (adhesive) strips in place. They may need to stay in place for 2 weeks or longer. If tape strips get loose and curl up, you may trim the loose edges. Do not remove tape strips completely unless your doctor says it is okay.  Do not take baths, swim, or use a hot tub until your doctor says it is okay. Ask your doctor if you can take showers. You may only be allowed to take sponge baths for bathing.  Check your surgical cut area every day for signs of infection. Check for:  More redness, swelling, or pain.  More fluid or blood.  Warmth.  Pus or a bad smell. Activity  Do not drive or  use heavy machinery while taking prescription pain medicine.  Do not lift anything that is heavier than 10 lb (4.5 kg) until your doctor says it is okay.  Do not play contact sports until your doctor says it is okay.  Do not drive for 24 hours if you were given a medicine to help you relax (sedative).  Rest as needed. Do not return to work or school until your doctor says it is okay. General instructions  Take over-the-counter and prescription medicines only as told by your doctor.  To prevent or treat constipation while you are taking prescription pain medicine, your doctor may recommend that you:  Drink enough fluid to keep your pee (urine) clear or pale yellow.  Take over-the-counter or prescription medicines.  Eat foods that are high in fiber, such as fresh fruits and vegetables, whole grains, and beans.  Limit foods that are high in fat and processed sugars, such as fried and sweet foods. Contact a doctor if:  You develop a rash.  You have more redness, swelling, or pain around your surgical cuts.  You have more fluid or blood coming from your surgical cuts.  Your surgical cuts feel warm to the touch.  You have pus or a bad smell coming from your surgical cuts.  You have a fever.  One or more of your surgical cuts breaks open. Get help right away if:  You have trouble breathing.  You have chest pain.  You have pain that is getting worse in your shoulders.  You faint or feel dizzy when you stand.  You have very bad pain in your belly (abdomen).  You are sick to your stomach (nauseous) for more than one day.  You have throwing up (vomiting) that lasts for more than one day.  You have leg pain. This information is not intended to replace advice given to you by your health care provider. Make sure you discuss any questions you have with your health care provider. Document Released: 07/24/2008 Document Revised: 05/05/2016 Document Reviewed: 04/02/2016 Elsevier  Interactive Patient Education  2017 [employer]  General Anesthesia, Adult General anesthesia is the use of medicines to make a person "go to sleep" (be unconscious) for a medical procedure. General anesthesia is often recommended when a procedure:  Is long.  Requires you to be still or in an unusual position.  Is major and can cause you to lose blood.  Is impossible to do without general anesthesia. The medicines used for general anesthesia are called general anesthetics. In addition to making you sleep, the medicines:  Prevent pain.  Control your blood pressure.  Relax your  muscles. Tell a health care provider about:  Any allergies you have.  All medicines you are taking, including vitamins, herbs, eye drops, creams, and over-the-counter medicines.  Any problems you or family members have had with anesthetic medicines.  Types of anesthetics you have had in the past.  Any bleeding disorders you have.  Any surgeries you have had.  Any medical conditions you have.  Any history of heart or lung conditions, such as heart failure, sleep apnea, or chronic obstructive pulmonary disease (COPD).  Whether you are pregnant or may be pregnant.  Whether you use tobacco, alcohol, marijuana, or street drugs.  Any history of Financial plannermilitary service.  Any history of depression or anxiety. What are the risks? Generally, this is a safe procedure. However, problems may occur, including:  Allergic reaction to anesthetics.  Lung and heart problems.  Inhaling food or liquids from your stomach into your lungs (aspiration).  Injury to nerves.  Waking up during your procedure and being unable to move (rare).  Extreme agitation or a state of mental confusion (delirium) when you wake up from the anesthetic.  Air in the bloodstream, which can lead to stroke. These problems are more likely to develop if you are having a major surgery or if you have an advanced medical condition. You can  prevent some of these complications by answering all of your health care provider's questions thoroughly and by following all pre-procedure instructions. General anesthesia can cause side effects, including:  Nausea or vomiting  A sore throat from the breathing tube.  Feeling cold or shivery.  Feeling tired, washed out, or achy.  Sleepiness or drowsiness.  Confusion or agitation. What happens before the procedure? Staying hydrated  Follow instructions from your health care provider about hydration, which may include:  Up to 2 hours before the procedure - you may continue to drink clear liquids, such as water, clear fruit juice, black coffee, and plain tea. Eating and drinking restrictions  Follow instructions from your health care provider about eating and drinking, which may include:  8 hours before the procedure - stop eating heavy meals or foods such as meat, fried foods, or fatty foods.  6 hours before the procedure - stop eating light meals or foods, such as toast or cereal.  6 hours before the procedure - stop drinking milk or drinks that contain milk.  2 hours before the procedure - stop drinking clear liquids. Medicines  Ask your health care provider about:  Changing or stopping your regular medicines. This is especially important if you are taking diabetes medicines or blood thinners.  Taking medicines such as aspirin and ibuprofen. These medicines can thin your blood. Do not take these medicines before your procedure if your health care provider instructs you not to.  Taking new dietary supplements or medicines. Do not take these during the week before your procedure unless your health care provider approves them.  If you are told to take a medicine or to continue taking a medicine on the day of the procedure, take the medicine with sips of water. General instructions   Ask if you will be going home the same day, the following day, or after a longer hospital  stay.  Plan to have someone take you home.  Plan to have someone stay with you for the first 24 hours after you leave the hospital or clinic.  For 3-6 weeks before the procedure, try not to use any tobacco products, such as cigarettes, chewing tobacco, and e-cigarettes.  You may brush your teeth on the morning of the procedure, but make sure to spit out the toothpaste. What happens during the procedure?  You will be given anesthetics through a mask and through an IV tube in one of your veins.  You may receive medicine to help you relax (sedative).  As soon as you are asleep, a breathing tube may be used to help you breathe.  An anesthesia specialist will stay with you throughout the procedure. He or she will help keep you comfortable and safe by continuing to give you medicines and adjusting the amount of medicine that you get. He or she will also watch your blood pressure, pulse, and oxygen levels to make sure that the anesthetics do not cause any problems.  If a breathing tube was used to help you breathe, it will be removed before you wake up. The procedure may vary among health care providers and hospitals. What happens after the procedure?  You will wake up, often slowly, after the procedure is complete, usually in a recovery area.  Your blood pressure, heart rate, breathing rate, and blood oxygen level will be monitored until the medicines you were given have worn off.  You may be given medicine to help you calm down if you feel anxious or agitated.  If you will be going home the same day, your health care provider may check to make sure you can stand, drink, and urinate.  Your health care providers will treat your pain and side effects before you go home.  Do not drive for 24 hours if you received a sedative.  You may:  Feel nauseous and vomit.  Have a sore throat.  Have mental slowness.  Feel cold or shivery.  Feel sleepy.  Feel tired.  Feel sore or achy, even  in parts of your body where you did not have surgery. This information is not intended to replace advice given to you by your health care provider. Make sure you discuss any questions you have with your health care provider. Document Released: 01/22/2008 Document Revised: 03/27/2016 Document Reviewed: 09/29/2015 Elsevier Interactive Patient Education  2017 [employer] Inc. General Anesthesia, Adult, Care After These instructions provide you with information about caring for yourself after your procedure. Your health care provider may also give you more specific instructions. Your treatment has been planned according to current medical practices, but problems sometimes occur. Call your health care provider if you have any problems or questions after your procedure. What can I expect after the procedure? After the procedure, it is common to have:  Vomiting.  A sore throat.  Mental slowness. It is common to feel:  Nauseous.  Cold or shivery.  Sleepy.  Tired.  Sore or achy, even in parts of your body where you did not have surgery. Follow these instructions at home: For at least 24 hours after the procedure:  Do not:  Participate in activities where you could fall or become injured.  Drive.  Use heavy machinery.  Drink alcohol.  Take sleeping pills or medicines that cause drowsiness.  Make important decisions or sign legal documents.  Take care of children on your own.  Rest. Eating and drinking  If you vomit, drink water, juice, or soup when you can drink without vomiting.  Drink enough fluid to keep your urine clear or pale yellow.  Make sure you have little or no nausea before eating solid foods.  Follow the diet recommended by your health care provider. General instructions  Have  a responsible adult stay with you until you are awake and alert.  Return to your normal activities as told by your health care provider. Ask your health care provider what activities  are safe for you.  Take over-the-counter and prescription medicines only as told by your health care provider.  If you smoke, do not smoke without supervision.  Keep all follow-up visits as told by your health care provider. This is important. Contact a health care provider if:  You continue to have nausea or vomiting at home, and medicines are not helpful.  You cannot drink fluids or start eating again.  You cannot urinate after 8-12 hours.  You develop a skin rash.  You have fever.  You have increasing redness at the site of your procedure. Get help right away if:  You have difficulty breathing.  You have chest pain.  You have unexpected bleeding.  You feel that you are having a life-threatening or urgent problem. This information is not intended to replace advice given to you by your health care provider. Make sure you discuss any questions you have with your health care provider. Document Released: 01/21/2001 Document Revised: 03/19/2016 Document Reviewed: 09/29/2015 Elsevier Interactive Patient Education  2017 [employer] Inc. PATIENT INSTRUCTIONS POST-ANESTHESIA  IMMEDIATELY FOLLOWING SURGERY:  Do not drive or operate machinery for the first twenty four hours after surgery.  Do not make any important decisions for twenty four hours after surgery or while taking narcotic pain medications or sedatives.  If you develop intractable nausea and vomiting or a severe headache please notify your doctor immediately.  FOLLOW-UP:  Please make an appointment with your surgeon as instructed. You do not need to follow up with anesthesia unless specifically instructed to do so.  WOUND CARE INSTRUCTIONS (if applicable):  Keep a dry clean dressing on the anesthesia/puncture wound site if there is drainage.  Once the wound has quit draining you may leave it open to air.  Generally you should leave the bandage intact for twenty four hours unless there is drainage.  If the epidural site drains  for more than 36-48 hours please call the anesthesia department.  QUESTIONS?:  Please feel free to call your physician or the hospital operator if you have any questions, and they will be happy to assist you.

## 2016-09-27 ENCOUNTER — Encounter (HOSPITAL_COMMUNITY)
Admission: RE | Admit: 2016-09-27 | Discharge: 2016-09-27 | Disposition: A | Discharge status: Home / Self Care | Payer: Self-pay | Source: Ambulatory Visit | Attending: Surgery | Admitting: Surgery

## 2016-09-27 ENCOUNTER — Encounter (HOSPITAL_COMMUNITY): Payer: Self-pay

## 2016-09-27 ENCOUNTER — Other Ambulatory Visit: Payer: Self-pay

## 2016-09-27 DIAGNOSIS — Z01810 Encounter for preprocedural cardiovascular examination: Secondary | ICD-10-CM | POA: Insufficient documentation

## 2016-09-27 DIAGNOSIS — Z01818 Encounter for other preprocedural examination: Secondary | ICD-10-CM | POA: Insufficient documentation

## 2016-09-27 DIAGNOSIS — K811 Chronic cholecystitis: Secondary | ICD-10-CM | POA: Insufficient documentation

## 2016-09-27 HISTORY — DX: Gastro-esophageal reflux disease without esophagitis: K21.9

## 2016-09-27 LAB — COMPREHENSIVE METABOLIC PANEL
ALK PHOS: 42 U/L (ref 38–126)
ALT: 27 U/L (ref 17–63)
AST: 30 U/L (ref 15–41)
Albumin: 4.2 g/dL (ref 3.5–5.0)
Anion gap: 8 (ref 5–15)
BILIRUBIN TOTAL: 0.1 mg/dL — AB (ref 0.3–1.2)
BUN: 6 mg/dL (ref 6–20)
CALCIUM: 9.3 mg/dL (ref 8.9–10.3)
CHLORIDE: 103 mmol/L (ref 101–111)
CO2: 27 mmol/L (ref 22–32)
CREATININE: 0.91 mg/dL (ref 0.61–1.24)
Glucose, Bld: 104 mg/dL — ABNORMAL HIGH (ref 65–99)
Potassium: 4.5 mmol/L (ref 3.5–5.1)
Sodium: 138 mmol/L (ref 135–145)
TOTAL PROTEIN: 6.8 g/dL (ref 6.5–8.1)

## 2016-09-27 LAB — CBC WITH DIFFERENTIAL/PLATELET
BASOS ABS: 0 10*3/uL (ref 0.0–0.1)
Basophils Relative: 1 %
EOS PCT: 6 %
Eosinophils Absolute: 0.3 10*3/uL (ref 0.0–0.7)
HEMATOCRIT: 41.2 % (ref 39.0–52.0)
HEMOGLOBIN: 13.7 g/dL (ref 13.0–17.0)
LYMPHS ABS: 1.7 10*3/uL (ref 0.7–4.0)
LYMPHS PCT: 37 %
MCH: 30 pg (ref 26.0–34.0)
MCHC: 33.3 g/dL (ref 30.0–36.0)
MCV: 90.2 fL (ref 78.0–100.0)
Monocytes Absolute: 0.3 10*3/uL (ref 0.1–1.0)
Monocytes Relative: 7 %
NEUTROS ABS: 2.3 10*3/uL (ref 1.7–7.7)
Neutrophils Relative %: 49 %
PLATELETS: 225 10*3/uL (ref 150–400)
RBC: 4.57 MIL/uL (ref 4.22–5.81)
RDW: 14.2 % (ref 11.5–15.5)
WBC: 4.6 10*3/uL (ref 4.0–10.5)

## 2016-10-01 ENCOUNTER — Ambulatory Visit (HOSPITAL_COMMUNITY)
Admission: RE | Admit: 2016-10-01 | Discharge: 2016-10-01 | Disposition: A | Discharge status: Home / Self Care | Payer: Self-pay | Source: Ambulatory Visit | Attending: Surgery | Admitting: Surgery

## 2016-10-01 ENCOUNTER — Ambulatory Visit (HOSPITAL_COMMUNITY): Payer: Self-pay | Admitting: Anesthesiology

## 2016-10-01 ENCOUNTER — Encounter (HOSPITAL_COMMUNITY)
Admission: RE | Disposition: A | Discharge status: Home / Self Care | Payer: Self-pay | Source: Ambulatory Visit | Attending: Surgery

## 2016-10-01 ENCOUNTER — Telehealth: Payer: Self-pay | Admitting: Gastroenterology

## 2016-10-01 ENCOUNTER — Encounter (HOSPITAL_COMMUNITY): Payer: Self-pay | Admitting: *Deleted

## 2016-10-01 DIAGNOSIS — K8010 Calculus of gallbladder with chronic cholecystitis without obstruction: Secondary | ICD-10-CM | POA: Insufficient documentation

## 2016-10-01 DIAGNOSIS — K589 Irritable bowel syndrome without diarrhea: Secondary | ICD-10-CM | POA: Insufficient documentation

## 2016-10-01 DIAGNOSIS — F411 Generalized anxiety disorder: Secondary | ICD-10-CM | POA: Insufficient documentation

## 2016-10-01 DIAGNOSIS — K219 Gastro-esophageal reflux disease without esophagitis: Secondary | ICD-10-CM | POA: Insufficient documentation

## 2016-10-01 DIAGNOSIS — F17210 Nicotine dependence, cigarettes, uncomplicated: Secondary | ICD-10-CM | POA: Insufficient documentation

## 2016-10-01 HISTORY — PX: CHOLECYSTECTOMY: SHX55

## 2016-10-01 SURGERY — LAPAROSCOPIC CHOLECYSTECTOMY
Anesthesia: General | Site: Abdomen

## 2016-10-01 MED ORDER — LABETALOL HCL 5 MG/ML IV SOLN
INTRAVENOUS | Status: AC
  Filled 2016-10-01: qty 4

## 2016-10-01 MED ORDER — FENTANYL CITRATE (PF) 250 MCG/5ML IJ SOLN
INTRAMUSCULAR | Status: AC
  Filled 2016-10-01: qty 5

## 2016-10-01 MED ORDER — LACTATED RINGERS IV SOLN
INTRAVENOUS | Status: DC
  Administered 2016-10-01: 1000 mL via INTRAVENOUS
  Administered 2016-10-01: 09:00:00 via INTRAVENOUS

## 2016-10-01 MED ORDER — MIDAZOLAM HCL 2 MG/2ML IJ SOLN
INTRAMUSCULAR | Status: AC
  Filled 2016-10-01: qty 2

## 2016-10-01 MED ORDER — LIDOCAINE HCL (PF) 1 % IJ SOLN
INTRAMUSCULAR | Status: AC
  Filled 2016-10-01: qty 30

## 2016-10-01 MED ORDER — FENTANYL CITRATE (PF) 100 MCG/2ML IJ SOLN
INTRAMUSCULAR | Status: DC | PRN
  Administered 2016-10-01 (×7): 50 ug via INTRAVENOUS

## 2016-10-01 MED ORDER — ONDANSETRON HCL 4 MG/2ML IJ SOLN
4.0000 mg | Freq: Once | INTRAMUSCULAR | Status: AC
  Administered 2016-10-01: 4 mg via INTRAVENOUS

## 2016-10-01 MED ORDER — NEOSTIGMINE METHYLSULFATE 10 MG/10ML IV SOLN
INTRAVENOUS | Status: DC | PRN
  Administered 2016-10-01: 3 mg via INTRAVENOUS

## 2016-10-01 MED ORDER — GLYCOPYRROLATE 0.2 MG/ML IJ SOLN
INTRAMUSCULAR | Status: DC | PRN
  Administered 2016-10-01: .6 mg via INTRAVENOUS

## 2016-10-01 MED ORDER — MIDAZOLAM HCL 2 MG/2ML IJ SOLN
1.0000 mg | INTRAMUSCULAR | Status: DC | PRN
Start: 2016-10-01 — End: 2016-10-01
  Administered 2016-10-01 (×2): 2 mg via INTRAVENOUS

## 2016-10-01 MED ORDER — SUCCINYLCHOLINE 20MG/ML (10ML) SYRINGE FOR MEDFUSION PUMP - OPTIME
INTRAMUSCULAR | Status: DC | PRN
  Administered 2016-10-01: 120 mg via INTRAVENOUS

## 2016-10-01 MED ORDER — BUPIVACAINE HCL (PF) 0.5 % IJ SOLN
INTRAMUSCULAR | Status: AC
  Filled 2016-10-01: qty 30

## 2016-10-01 MED ORDER — FENTANYL CITRATE (PF) 100 MCG/2ML IJ SOLN
INTRAMUSCULAR | Status: AC
  Filled 2016-10-01: qty 2

## 2016-10-01 MED ORDER — ROCURONIUM 10MG/ML (10ML) SYRINGE FOR MEDFUSION PUMP - OPTIME
INTRAVENOUS | Status: DC | PRN
Start: 2016-10-01 — End: 2016-10-01
  Administered 2016-10-01: 5 mg via INTRAVENOUS
  Administered 2016-10-01 (×3): 10 mg via INTRAVENOUS
  Administered 2016-10-01: 25 mg via INTRAVENOUS

## 2016-10-01 MED ORDER — PROPOFOL 10 MG/ML IV BOLUS
INTRAVENOUS | Status: AC
  Filled 2016-10-01: qty 40

## 2016-10-01 MED ORDER — LIDOCAINE HCL (PF) 1 % IJ SOLN
INTRAMUSCULAR | Status: AC
  Filled 2016-10-01: qty 5

## 2016-10-01 MED ORDER — ONDANSETRON HCL 4 MG/2ML IJ SOLN
INTRAMUSCULAR | Status: AC
  Filled 2016-10-01: qty 2

## 2016-10-01 MED ORDER — CHLORHEXIDINE GLUCONATE CLOTH 2 % EX PADS: 6.0000 | MEDICATED_PAD | Freq: Once | CUTANEOUS | Status: DC

## 2016-10-01 MED ORDER — LIDOCAINE HCL 1 % IJ SOLN
INTRAMUSCULAR | Status: DC | PRN
  Administered 2016-10-01: 18 mL

## 2016-10-01 MED ORDER — ROCURONIUM BROMIDE 50 MG/5ML IV SOLN
INTRAVENOUS | Status: AC
Start: 2016-10-01 — End: 2016-10-01
  Filled 2016-10-01: qty 1

## 2016-10-01 MED ORDER — GLYCOPYRROLATE 0.2 MG/ML IJ SOLN
INTRAMUSCULAR | Status: AC
  Filled 2016-10-01: qty 7

## 2016-10-01 MED ORDER — PROPOFOL 10 MG/ML IV BOLUS
INTRAVENOUS | Status: AC
  Filled 2016-10-01: qty 20

## 2016-10-01 MED ORDER — PROPOFOL 10 MG/ML IV BOLUS
INTRAVENOUS | Status: DC | PRN
  Administered 2016-10-01: 170 mg via INTRAVENOUS

## 2016-10-01 MED ORDER — SODIUM CHLORIDE 0.9 % IR SOLN
Status: DC | PRN
  Administered 2016-10-01: 1000 mL

## 2016-10-01 MED ORDER — FENTANYL CITRATE (PF) 100 MCG/2ML IJ SOLN
25.0000 ug | INTRAMUSCULAR | Status: DC | PRN
  Administered 2016-10-01 (×2): 50 ug via INTRAVENOUS

## 2016-10-01 MED ORDER — SUCCINYLCHOLINE CHLORIDE 20 MG/ML IJ SOLN
INTRAMUSCULAR | Status: AC
  Filled 2016-10-01: qty 1

## 2016-10-01 MED ORDER — NEOSTIGMINE METHYLSULFATE 10 MG/10ML IV SOLN
INTRAVENOUS | Status: AC
  Filled 2016-10-01: qty 1

## 2016-10-01 MED ORDER — CEFAZOLIN SODIUM-DEXTROSE 2-4 GM/100ML-% IV SOLN
2.0000 g | INTRAVENOUS | Status: AC
  Administered 2016-10-01: 2 g via INTRAVENOUS
  Filled 2016-10-01: qty 100

## 2016-10-01 MED ORDER — LIDOCAINE HCL (CARDIAC) 10 MG/ML IV SOLN
INTRAVENOUS | Status: DC | PRN
  Administered 2016-10-01: 40 mg via INTRAVENOUS

## 2016-10-01 MED ORDER — HEMOSTATIC AGENTS (NO CHARGE) OPTIME
TOPICAL | Status: DC | PRN
  Administered 2016-10-01: 1 via TOPICAL

## 2016-10-01 MED ORDER — OXYCODONE-ACETAMINOPHEN 5-325 MG PO TABS: 1.0000 | ORAL_TABLET | ORAL | 0 refills | Status: DC | PRN

## 2016-10-01 MED ORDER — ROCURONIUM BROMIDE 50 MG/5ML IV SOLN
INTRAVENOUS | Status: AC
  Filled 2016-10-01: qty 1

## 2016-10-01 SURGICAL SUPPLY — 50 items
ADH SKN CLS APL DERMABOND .7 (GAUZE/BANDAGES/DRESSINGS) ×1
APPLIER CLIP LAPSCP 10X32 DD (CLIP) ×3 IMPLANT
BAG HAMPER (MISCELLANEOUS) ×3 IMPLANT
BAG SPEC RTRVL LRG 6X4 10 (ENDOMECHANICALS) ×1
CHLORAPREP W/TINT 26ML (MISCELLANEOUS) ×3 IMPLANT
CLOTH BEACON ORANGE TIMEOUT ST (SAFETY) ×3 IMPLANT
COVER LIGHT HANDLE STERIS (MISCELLANEOUS) ×6 IMPLANT
DECANTER SPIKE VIAL GLASS SM (MISCELLANEOUS) ×6 IMPLANT
DERMABOND ADVANCED (GAUZE/BANDAGES/DRESSINGS) ×2
DERMABOND ADVANCED .7 DNX12 (GAUZE/BANDAGES/DRESSINGS) ×1 IMPLANT
DEVICE TROCAR PUNCTURE CLOSURE (ENDOMECHANICALS) ×3 IMPLANT
ELECT REM PT RETURN 9FT ADLT (ELECTROSURGICAL) ×3
ELECTRODE REM PT RTRN 9FT ADLT (ELECTROSURGICAL) ×1 IMPLANT
FILTER SMOKE EVAC LAPAROSHD (FILTER) ×3 IMPLANT
FORMALIN 10 PREFIL 120ML (MISCELLANEOUS) ×3 IMPLANT
GLOVE BIOGEL PI IND STRL 7.0 (GLOVE) ×1 IMPLANT
GLOVE BIOGEL PI IND STRL 7.5 (GLOVE) ×1 IMPLANT
GLOVE BIOGEL PI INDICATOR 7.0 (GLOVE) ×2
GLOVE BIOGEL PI INDICATOR 7.5 (GLOVE) ×2
GLOVE ECLIPSE 6.5 STRL STRAW (GLOVE) ×2 IMPLANT
GLOVE ECLIPSE 7.0 STRL STRAW (GLOVE) ×3 IMPLANT
GLOVE EXAM NITRILE MD LF STRL (GLOVE) ×2 IMPLANT
GLOVE SURG SS PI 7.0 STRL IVOR (GLOVE) ×2 IMPLANT
GOWN STRL REUS W/ TWL XL LVL3 (GOWN DISPOSABLE) ×1 IMPLANT
GOWN STRL REUS W/TWL LRG LVL3 (GOWN DISPOSABLE) ×6 IMPLANT
GOWN STRL REUS W/TWL XL LVL3 (GOWN DISPOSABLE) ×3
HEMOSTAT SNOW SURGICEL 2X4 (HEMOSTASIS) ×3 IMPLANT
INST SET LAPROSCOPIC AP (KITS) ×3 IMPLANT
IV NS IRRIG 3000ML ARTHROMATIC (IV SOLUTION) IMPLANT
KIT ROOM TURNOVER APOR (KITS) ×3 IMPLANT
MANIFOLD NEPTUNE II (INSTRUMENTS) ×3 IMPLANT
NDL INSUFFLATION 14GA 120MM (NEEDLE) ×1 IMPLANT
NEEDLE INSUFFLATION 14GA 120MM (NEEDLE) ×3 IMPLANT
NS IRRIG 1000ML POUR BTL (IV SOLUTION) ×3 IMPLANT
PACK LAP CHOLE LZT030E (CUSTOM PROCEDURE TRAY) ×3 IMPLANT
PAD ARMBOARD 7.5X6 YLW CONV (MISCELLANEOUS) ×3 IMPLANT
POUCH SPECIMEN RETRIEVAL 10MM (ENDOMECHANICALS) ×3 IMPLANT
SET BASIN LINEN APH (SET/KITS/TRAYS/PACK) ×3 IMPLANT
SET TUBE IRRIG SUCTION NO TIP (IRRIGATION / IRRIGATOR) IMPLANT
SLEEVE ENDOPATH XCEL 5M (ENDOMECHANICALS) ×6 IMPLANT
SOLUTION ANTI FOG 6CC (MISCELLANEOUS) ×2 IMPLANT
SUT VIC AB 4-0 PS2 27 (SUTURE) ×3 IMPLANT
SUT VICRYL 0 UR6 27IN ABS (SUTURE) ×3 IMPLANT
SUT VICRYL AB 3-0 FS1 BRD 27IN (SUTURE) ×3 IMPLANT
TROCAR ENDO BLADELESS 11MM (ENDOMECHANICALS) ×3 IMPLANT
TROCAR XCEL NON-BLD 5MMX100MML (ENDOMECHANICALS) ×3 IMPLANT
TUBE CONNECTING 12'X1/4 (SUCTIONS)
TUBE CONNECTING 12X1/4 (SUCTIONS) IMPLANT
TUBING INSUFFLATION (TUBING) ×3 IMPLANT
WARMER LAPAROSCOPE (MISCELLANEOUS) ×3 IMPLANT

## 2016-10-01 NOTE — Anesthesia Procedure Notes (Signed)
Procedure Name: Intubation Date/Time: 10/01/2016 7:43 AM Performed by: Glynn OctaveANIEL, Anneta Rounds E Pre-anesthesia Checklist: Patient identified, Patient being monitored, Timeout performed, Emergency Drugs available and Suction available Patient Re-evaluated:Patient Re-evaluated prior to inductionOxygen Delivery Method: Circle system utilized Preoxygenation: Pre-oxygenation with 100% oxygen Intubation Type: IV induction, Rapid sequence and Cricoid Pressure applied Ventilation: Mask ventilation without difficulty Laryngoscope Size: Mac and 3 Grade View: Grade I Tube type: Oral Tube size: 7.0 mm Number of attempts: 1 Airway Equipment and Method: Stylet Placement Confirmation: ETT inserted through vocal cords under direct vision,  positive ETCO2 and breath sounds checked- equal and bilateral Secured at: 21 cm Tube secured with: Tape Dental Injury: Teeth and Oropharynx as per pre-operative assessment

## 2016-10-01 NOTE — Discharge Instructions (Addendum)
In addition to included general post-operative instructions for Laparoscopic Cholecystectomy,  Diet: Resume home heart healthy diet. Until your body adapts to not having a gallbladder, fattier foods (meats, cheeses/dairy, and fried foods) may cause loose bowel movements.  Activity: No heavy lifting (children, pets, laundry, trash bags) or strenuous activity until follow-up, but light activity and walking are encouraged. Do not drive or drink alcohol if taking narcotic pain medications.   Wound care: 2 days after surgery (Wednesday, 12/6), may shower/get incision wet with soapy water and pat dry (do not rub incisions), but no baths or submerging incision underwater until follow-up.   Medications: Resume all home medications. For mild to moderate pain: acetaminophen (Tylenol) or ibuprofen (if no kidney disease). Narcotic pain medications, if prescribed, can be used for severe pain, though may cause nausea, constipation, and drowsiness. Do not combine Tylenol and Percocet within a 6 hour period as Percocet contains Tylenol. If you do not need the narcotic pain medication, you do not need to fill the prescription.  Call office [phone]) at any time if any questions, worsening pain, fevers/chills, bleeding, drainage from incision site, or other concerns.  PATIENT INSTRUCTIONS POST-ANESTHESIA  IMMEDIATELY FOLLOWING SURGERY:  Do not drive or operate machinery for the first twenty four hours after surgery.  Do not make any important decisions for twenty four hours after surgery or while taking narcotic pain medications or sedatives.  If you develop intractable nausea and vomiting or a severe headache please notify your doctor immediately.  FOLLOW-UP:  Please make an appointment with your surgeon as instructed. You do not need to follow up with anesthesia unless specifically instructed to do so.  WOUND CARE INSTRUCTIONS (if applicable):  Keep a dry clean dressing on the anesthesia/puncture wound site  if there is drainage.  Once the wound has quit draining you may leave it open to air.  Generally you should leave the bandage intact for twenty four hours unless there is drainage.  If the epidural site drains for more than 36-48 hours please call the anesthesia department.  QUESTIONS?:  Please feel free to call your physician or the hospital operator if you have any questions, and they will be happy to assist you.

## 2016-10-01 NOTE — Telephone Encounter (Signed)
Debbie from Dr Lovell SheehanJenkins called for MB to let her know that patient had his GB surgery today.

## 2016-10-01 NOTE — Telephone Encounter (Signed)
Noted  

## 2016-10-01 NOTE — Transfer of Care (Signed)
Immediate Anesthesia Transfer of Care Note  Patient: Bradley Waller  Procedure(s) Performed: Procedure(s): LAPAROSCOPIC CHOLECYSTECTOMY (N/A)  Patient Location: PACU  Anesthesia Type:MAC  Level of Consciousness: awake and alert   Airway & Oxygen Therapy: Patient Spontanous Breathing and Patient connected to face mask oxygen  Post-op Assessment: Report given to RN  Post vital signs: Reviewed and stable  Last Vitals:  Vitals:   10/01/16 0730 10/01/16 1006  BP: 136/87 (!) 167/102  Pulse:  (!) 112  Resp: (!) 23 14  Temp:  37 C    Last Pain:  Vitals:   10/01/16 0637  TempSrc: Oral  PainSc: 1       Patients Stated Pain Goal: 7 (10/01/16 [id number])  Complications: No apparent anesthesia complications

## 2016-10-01 NOTE — Anesthesia Postprocedure Evaluation (Signed)
Anesthesia Post Note  Patient: Bradley Waller  Procedure(s) Performed: Procedure(s) (LRB): LAPAROSCOPIC CHOLECYSTECTOMY (N/A)  Patient location during evaluation: PACU Anesthesia Type: General Level of consciousness: awake and alert and oriented Pain management: satisfactory to patient Vital Signs Assessment: post-procedure vital signs reviewed and stable Respiratory status: spontaneous breathing Cardiovascular status: blood pressure returned to baseline Postop Assessment: no signs of nausea or vomiting Anesthetic complications: no    Last Vitals:  Vitals:   10/01/16 1030 10/01/16 1045  BP: (!) 148/106 (!) 148/101  Pulse: 95 95  Resp: (!) 9 11  Temp:      Last Pain:  Vitals:   10/01/16 1030  TempSrc:   PainSc: (P) 0-No pain                 [name]

## 2016-10-01 NOTE — Anesthesia Preprocedure Evaluation (Signed)
Anesthesia Evaluation  Patient identified by MRN, date of birth, ID band Patient awake    Reviewed: Allergy & Precautions, NPO status , Patient's Chart, lab work & pertinent test results  Airway Mallampati: I  TM Distance: >3 FB     Dental  (+) Edentulous Upper, Edentulous Lower   Pulmonary Current Smoker,    breath sounds clear to auscultation       Cardiovascular negative cardio ROS   Rhythm:Regular Rate:Normal     Neuro/Psych Anxiety    GI/Hepatic GERD  Controlled,(+)     substance abuse (in remission)  alcohol use and IV drug use,   Endo/Other    Renal/GU      Musculoskeletal   Abdominal   Peds  Hematology   Anesthesia Other Findings   Reproductive/Obstetrics                             Anesthesia Physical Anesthesia Plan  ASA: III  Anesthesia Plan: General   Post-op Pain Management:    Induction: Intravenous, Rapid sequence and Cricoid pressure planned  Airway Management Planned: Oral ETT  Additional Equipment:   Intra-op Plan:   Post-operative Plan: Extubation in OR  Informed Consent: I have reviewed the patients History and Physical, chart, labs and discussed the procedure including the risks, benefits and alternatives for the proposed anesthesia with the patient or authorized representative who has indicated his/her understanding and acceptance.     Plan Discussed with:   Anesthesia Plan Comments:         Anesthesia Quick Evaluation

## 2016-10-01 NOTE — Op Note (Signed)
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)  PROCEDURE(S): (cpt's: 47562) 1.) Laparoscopic Cholecystectomy  INTRAOPERATIVE FINDINGS: Severe dense chronic inflammatory scarring and adhesions to liver and surrounding structures obscuring adherent and adjacent anatomic structures, clearly identified cystic artery and cystic duct  INTRAOPERATIVE FLUIDS: 1100 mL crystalloid   ESTIMATED BLOOD LOSS: Minimal (<30 mL)   URINE OUTPUT: No foley  SPECIMENS: Gallbladder  IMPLANTS: None  DRAINS: None   COMPLICATIONS: None apparent   CONDITION AT COMPLETION: Hemodynamically stable and extubated  DISPOSITION: PACU   INDICATION(S) FOR PROCEDURE:  Patient is a 45 y.o. male who presented with 3 years of worsening severe RUQ > epigastric abdominal pain after eating fatty foods in particular. Ultrasound suggested acute cholecystitis, but patient continued to eat fatty and fried foods until recently being referred for evaluation and cholecystectomy. All risks, benefits, and alternatives to above elective procedures were discussed with the patient, who elected to proceed, and informed consent was accordingly obtained at that time.   DETAILS OF PROCEDURE:  Patient was brought to the operating suite and appropriately identified. General anesthesia was administered along with peri-operative prophylactic IV antibiotics, and endotracheal intubation was performed by anesthesiologist, along with NG/OG tube for gastric decompression. In supine position, operative site was prepped and draped in usual sterile fashion, and following a brief time out, initial 5 mm incision was made in a natural skin crease just above the umbilicus. Fascia was then elevated, and a Verress needle was inserted and its  proper position confirmed using aspiration and saline meniscus test.  Upon insufflation of the abdominal cavity with carbon dioxide to a well-tolerated pressure of 12-15 mmHg, 5 mm peri-umbilical port followed by laparoscope were inserted and used to inspect the abdominal cavity and its contents with no injuries from insertion of the first trochar noted. Three additional trocars were inserted, one at the epigastric position (10 mm) and two along the Right costal margin (5 mm). The table was then placed in reverse Trendelenburg position with the Right side up. Dense fibrous inflammatory adhesions between the gallbladder and omentum, duodenum, CBD, and transverse colon were carefully delineated and lysed using combined blunt and sharp dissection. Divided dense fibrous adhesions at the apex of the firm otherwise difficult to grasp gallbladder were grasped with an atraumatic grasper passed through the lateral port and retracted apically over the liver. The infundibulum was also grasped and retracted, exposing Calot's triangle following extensive careful and deliberate/meticulous dissection. The peritoneum overlying the gallbladder infundibulum was incised and dissected free of surrounding peritoneal attachments, revealing the cystic duct and cystic artery, which were clipped twice on the patient side and once on the gallbladder specimen side close to the gallbladder. The gallbladder was then dissected from its peritoneal attachments to the liver and adjacent surrounding structures using electrocautery, and the gallbladder was placed into a laparoscopic specimen bag and removed from the abdominal cavity via the epigastric port site. Hemostasis and secure placement of clips were confirmed, and intra-peritoneal cavity was inspected with no additional findings. Endoclose laparoscopic fascial closure device was then used to re-approximate fascia at the 10 mm epigastric port site.  All ports were then removed under direct  visualization, and abdominal cavity was desuflated. All port sites were irrigated/cleaned, additional local anesthetic was injected at each incision, 3-0 Vicryl was used to re-approximate dermis at 10 mm port site(s), and  buried interrupted 4-0 Monocryl suture was used to re-approximate skin. Skin was then cleaned, dried, and sterile skin glue was applied. Patient was then safely able to be awakened, extubated, and transferred to PACU for post-operative monitoring and care.   I was present for all aspects of procedure, and there were no intra-operative complications apparent.

## 2016-10-01 NOTE — Interval H&P Note (Signed)
History and Physical Interval Note:  10/01/2016 7:27 AM  Bradley Waller  has presented today for surgery, with the diagnosis of chronic cholecystitis  The various methods of treatment have been discussed with the patient and family. After consideration of risks, benefits and other options for treatment, the patient has consented to  Procedure(s): LAPAROSCOPIC CHOLECYSTECTOMY (N/A) as a surgical intervention .  The patient's history has been reviewed, patient examined, no change in status, stable for surgery.  I have reviewed the patient's chart and labs.  Questions were answered to the patient's satisfaction.     Ancil LinseyJason Evan Berdella Bacot

## 2016-10-01 NOTE — H&P (Signed)
[facility] History and Physical  Subjective: 45 year old male presents for evaluation and management of cholecystitis demonstrated on recent abdominal ultrasound. Patient reports he has experienced intermittent epigastric and RUQ abdominal pain >3 years, particularly after eating fried chicken and macaroni and cheese. He describes that such episodes initially occurred only every 6 months or so, but he states that pain has become more frequent over the past 4 months and nearly constant over then past few weeks. He says he hasn't experienced another acute episode since he was recently advised to avoid fatty foods such as fried chicken, but he adds that he is unable to stop eating macaroni and cheese. He otherwise denies any fever/chills, CP, or SOB and says that the diarrhea he experienced over the past 1-2 months with 3 - 5 episodes of loose watery stools daily has resolved after he was prescribed Bentyl for irritable bowel syndrome by GI following c.diff and infectious workup as well as workup for peptic ulcer disease without EGD.   Review of Symptoms:  Constitutional:No fevers, chills, or unexplained weight loss Head:Atraumatic; no masses; no abnormalities Eyes:No visual changes or eye pain Cardiovascular: No chest pain or palpitations.  Respiratory:No cough, shortness of breath or wheezing  GastrointestinAbdominal pain and diarrhea as per HPI Genitourinary:No urinary frequency, hematuria, incontinence, or dysuria Musculoskeletal:No arthalgias, myalgias or joint swelling Skin:No rash or bothersome skin lesions   Past Medical History:Reviewed  Past Medical History  Surgical History:  Denies any prior surgeries Medical Problems:  Irritable bowel syndrome, polysubstance abuse including alcohol Psychiatric History:  Generalized anxiety disorder Allergies:   NKDA Medications:   Bentyl, Protonix (previously prescribed Valium, but hasn't taken in 1 year)   Social  History:Reviewed  Social History  Preferred Language: English Race:  White Ethnicity: Not Hispanic / Latino Age: [age] year Marital Status:  S Alcohol:  Reports alcohol abstinence x 5-6 years  Smoking Status: Heavy tobacco smoker reviewed on 09/26/2016 Started Date: 09/26/1986 Packs per day: 1.00  Functional Status ------------------------------------------------ Bathing: Normal Cooking: Normal Dressing: Normal Driving: Normal Eating: Normal Managing Meds: Normal Oral Care: Normal Shopping: Normal Toileting: Normal Transferring: Normal Walking: Normal  Cognitive Status ------------------------------------------------ Attention: Normal Decision Making: Normal Language: Normal Memory: Normal Motor: Normal Perception: Normal Problem Solving: Normal Visual and Spatial: Normal   Family History:Reviewed  Family Health History Family History is Unknown  Vital Signs as of 09/26/2016: Systolic 141: Diastolic 110: Heart Rate 101: Temp 99.23F (Temporal) Height 565ft 11in: Weight 184Lbs 0 Ounces:  BMI 25.66 kg/m2  Physical Exam: General:Normal thin body habitus with general anxiety in no apparent distress Skin:no rash or prominent lesions Head:Atraumatic; no masses; no abnormalities Eyes:conjunctiva clear, EOM intact, PERRL Heart:RRR, no murmur Lungs:CTA bilaterally, no wheezes, rhonchi, rales.  Breathing unlabored. Abdomen:Soft, mild epigastric and RUQ abdominal tenderness to palpation, no HSM, no masses. Extremities:No deformities, clubbing, cyanosis, or edema.   Assessment: 45 year old Male with cholecystitis, complicated by pertinent comorbidities including generalized anxiety disorder and recently diagnosed irritable bowel syndrome, prior alcohol abuse.  Diagnosis & Procedure Smart Code  Plan:      - advised to avoid fatty foods such meets, cheeses/dairy, and fried foods      - all risks, benefits, and alternatives to cholecystectomy were  discussed with the patient, who elects to proceed, and informed consent was accordingly obtained      - will plan for elective outpatient laparoscopic cholecystectomy Monday, 12/4 as scheduled      - surgical follow-up 2 weeks following planned procedure      -  patient instructed to call if questions  -- Barbara CowerJason E. Earlene Plateravis, MD, RPVI [place]: [facility] Office #: [phone]

## 2016-10-03 ENCOUNTER — Encounter (HOSPITAL_COMMUNITY): Payer: Self-pay | Admitting: Surgery

## 2018-09-17 IMAGING — CT CT ABD-PELV W/ CM
2 of 5 series · 16 of 46 positions shown, 18 images · IV contrast (APPLIED)
Comparison: None.

CLINICAL DATA: Upper abdominal pain for 3 years

EXAM:
CT ABDOMEN AND PELVIS WITH CONTRAST
TECHNIQUE: Multidetector CT imaging of the abdomen and pelvis was performed
using the standard protocol following bolus administration of
intravenous contrast.
CONTRAST:  100mL CP7TNC-T22 IOPAMIDOL (CP7TNC-T22) INJECTION 61%

[Series 2: axial st · axial · 0.85mm/px · z∈[-362,+58]mm · 13 of 98 slices shown, 15 images]
[im 7/98  soft-tissue]
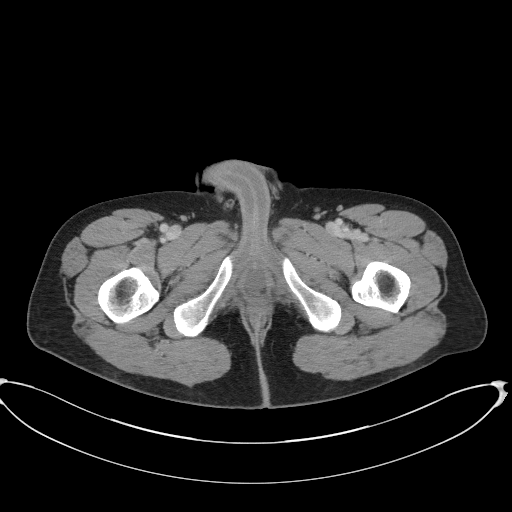
[im 7/98  bone]
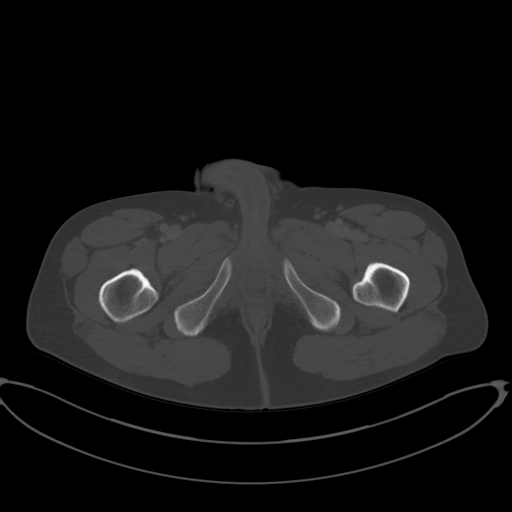
[im 13/98  soft-tissue]
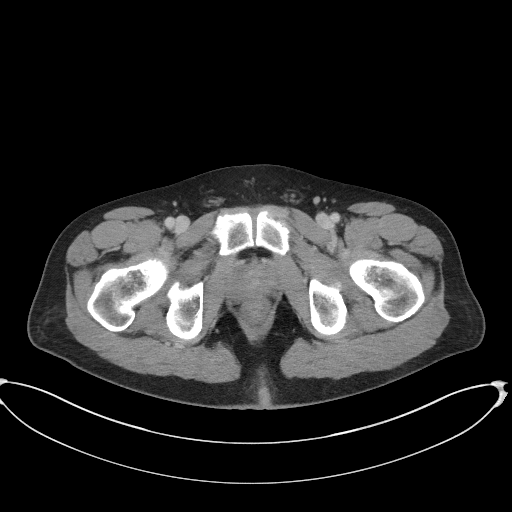
[im 19/98  soft-tissue]
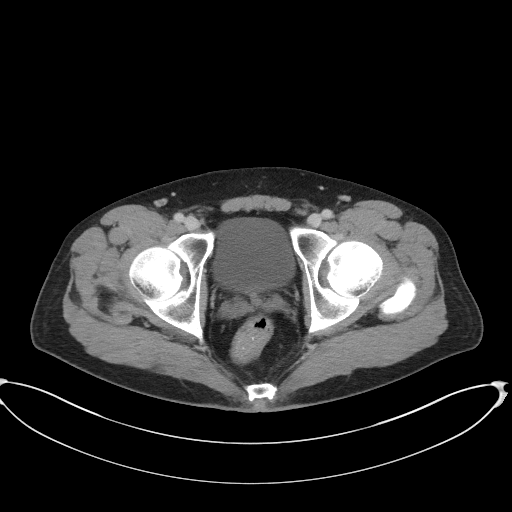
[im 31/98  soft-tissue]
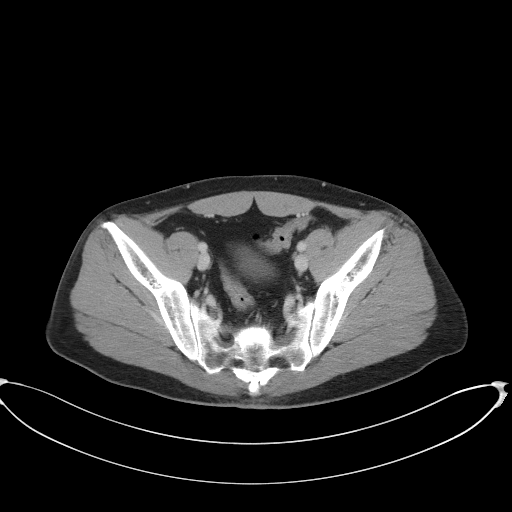
[im 37/98  soft-tissue]
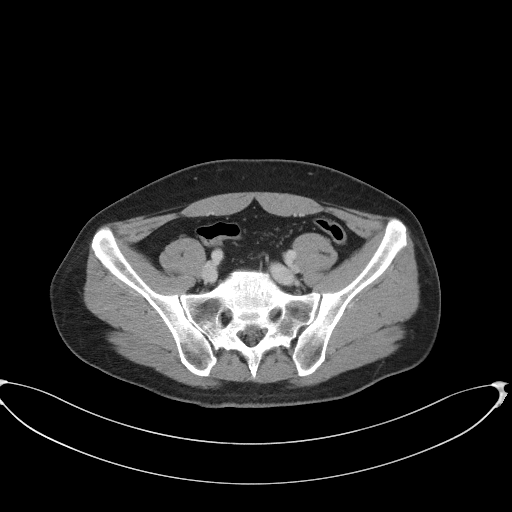
[im 43/98  soft-tissue]
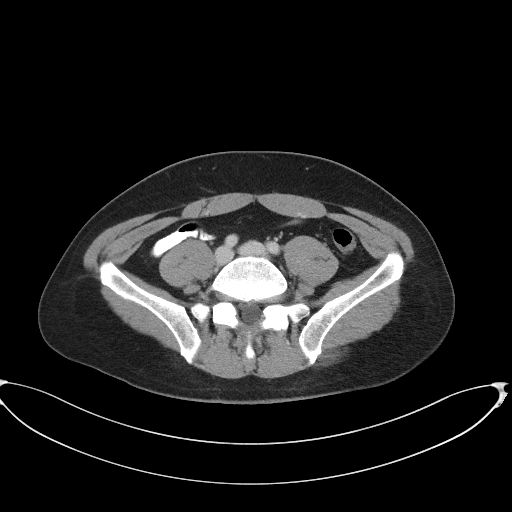
[im 49/98  soft-tissue]
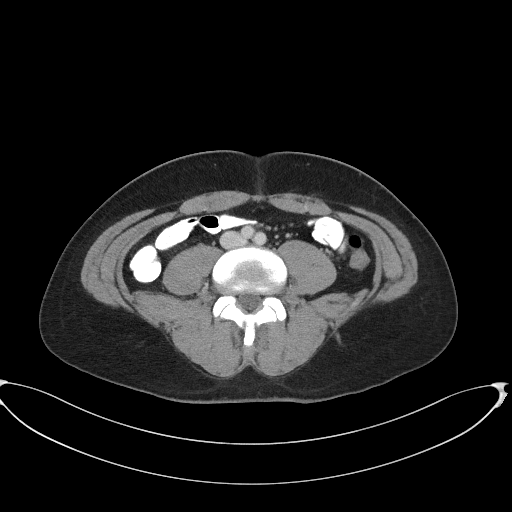
[im 55/98  soft-tissue]
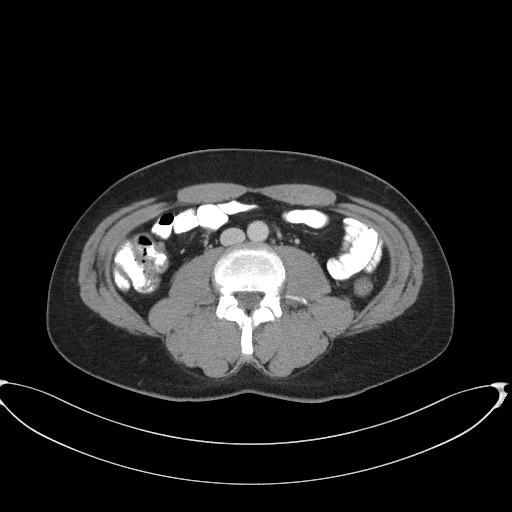
[im 61/98  soft-tissue]
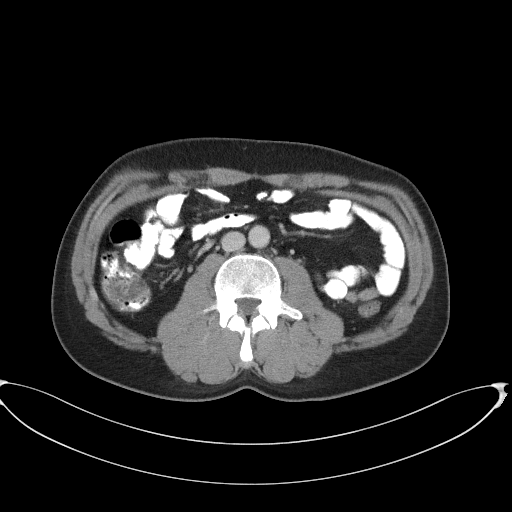
[im 61/98  bone]
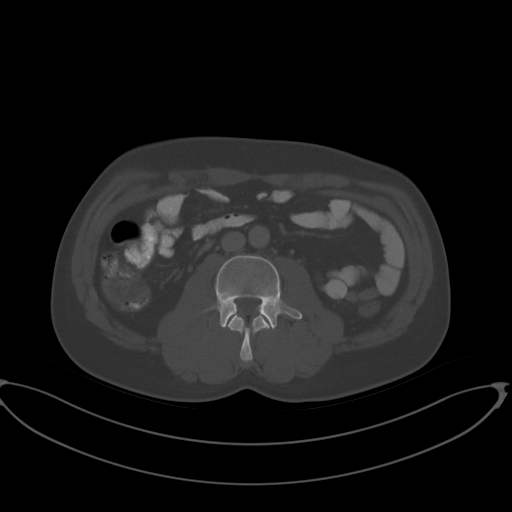
[im 67/98  soft-tissue]
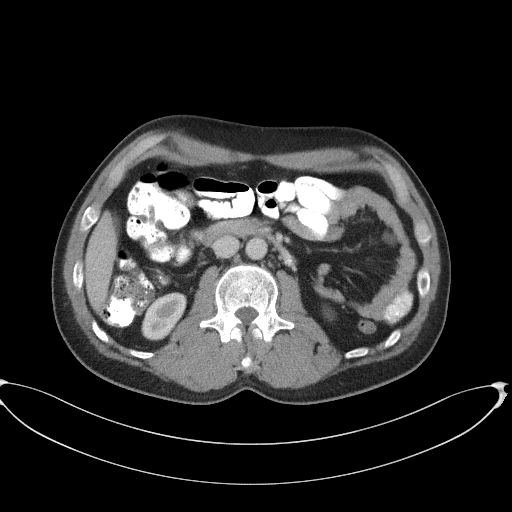
[im 79/98  soft-tissue]
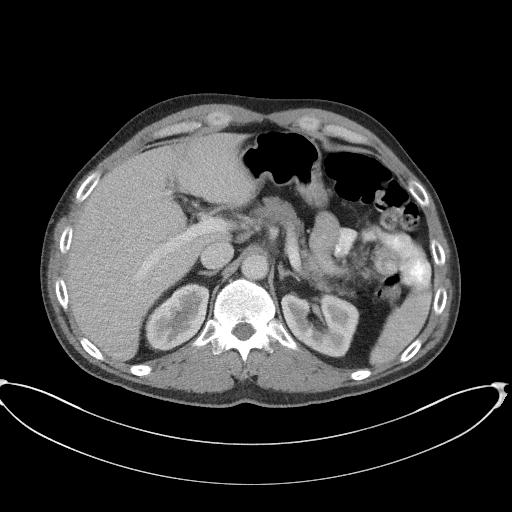
[im 85/98  soft-tissue]
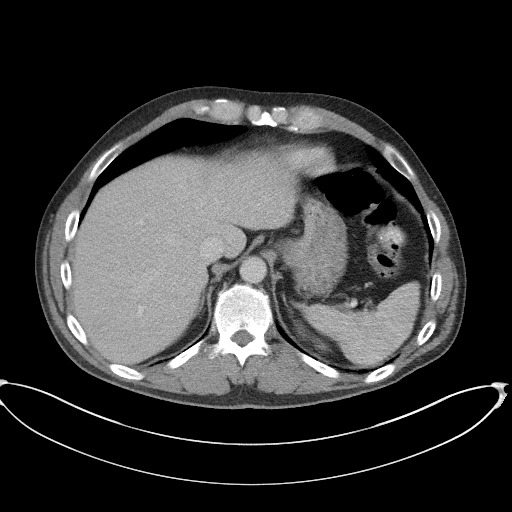
[im 91/98  soft-tissue]
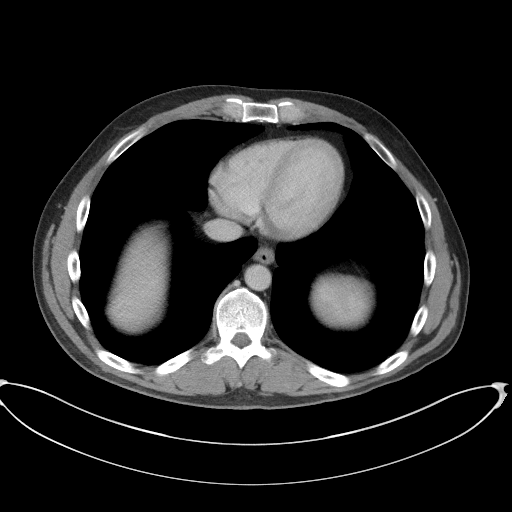

[Series 5: coronal st · coronal · 0.85mm/px · 3 of 100 slices shown]
[im 34/100  soft-tissue]
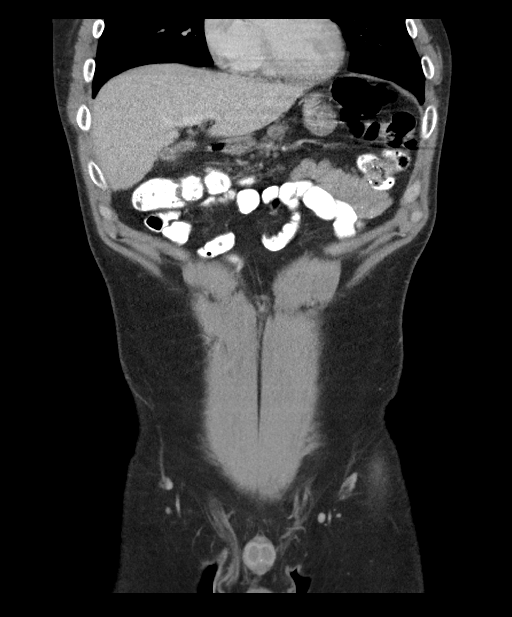
[im 45/100  soft-tissue]
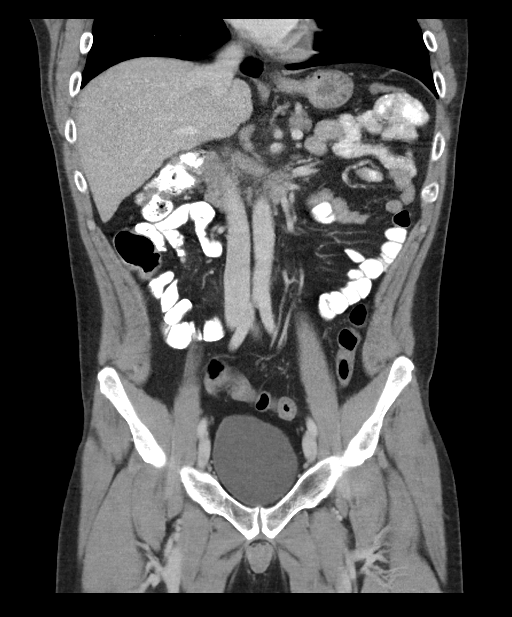
[im 56/100  soft-tissue]
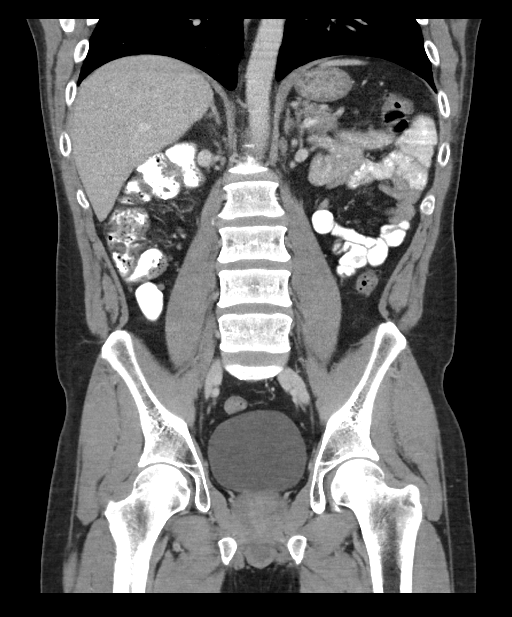

[16 of 46 positions shown; findings below may reference images not displayed]

FINDINGS: Lower chest: Lung bases are clear and without consolidation or
effusion.

Hepatobiliary: Mild focal hypodensity near the falciform ligament
could relate to focal fatty infiltration. Otherwise no focal hepatic
abnormality. There appears to be mild wall thickening of the
gallbladder, and no calcified stones or sludge. No surrounding
fluid. No biliary dilatation.

Pancreas: Unremarkable. No pancreatic ductal dilatation or
surrounding inflammatory changes.

Spleen: Normal in size without focal abnormality.

Adrenals/Urinary Tract: Adrenal glands are unremarkable. Kidneys are
normal, without renal calculi, focal lesion, or hydronephrosis.
Bladder is unremarkable.

Stomach/Bowel: Stomach is within normal limits. Appendix appears
normal. No evidence of bowel wall thickening, distention, or
inflammatory changes.

Vascular/Lymphatic: No significant vascular findings are present. No
enlarged abdominal or pelvic lymph nodes.

Reproductive: Prostate is unremarkable.

Other: No abdominal wall hernia or abnormality. No abdominopelvic
ascites.

Musculoskeletal: No acute or significant osseous findings. Minimal
wedging of lower thoracic vertebra, likely chronic.
IMPRESSION: 1. Mild wall thickening of the gallbladder. No calcified stones or
surrounding fluid. No biliary dilatation. Further evaluation with
ultrasound may be obtained.
2. Probable small amount of focal fatty infiltration near the
falciform ligament.
3. No acute intra-abdominal or pelvic abnormalities.

## 2023-07-25 ENCOUNTER — Other Ambulatory Visit: Payer: Self-pay

## 2023-07-25 ENCOUNTER — Emergency Department (HOSPITAL_COMMUNITY)
Admission: EM | Admit: 2023-07-25 | Discharge: 2023-07-25 | Disposition: A | Discharge status: Home / Self Care | Payer: Self-pay | Attending: Emergency Medicine | Admitting: Emergency Medicine

## 2023-07-25 ENCOUNTER — Encounter (HOSPITAL_COMMUNITY): Payer: Self-pay

## 2023-07-25 DIAGNOSIS — F22 Delusional disorders: Secondary | ICD-10-CM | POA: Insufficient documentation

## 2023-07-25 LAB — COMPREHENSIVE METABOLIC PANEL
ALT: 29 U/L (ref 0–44)
AST: 25 U/L (ref 15–41)
Albumin: 4.4 g/dL (ref 3.5–5.0)
Alkaline Phosphatase: 40 U/L (ref 38–126)
Anion gap: 9 (ref 5–15)
BUN: 8 mg/dL (ref 6–20)
CO2: 24 mmol/L (ref 22–32)
Calcium: 9.1 mg/dL (ref 8.9–10.3)
Chloride: 105 mmol/L (ref 98–111)
Creatinine, Ser: 0.95 mg/dL (ref 0.61–1.24)
GFR, Estimated: >60 mL/min (ref >60)
Glucose, Bld: 104 mg/dL — ABNORMAL HIGH (ref 70–99)
Potassium: 3 mmol/L — ABNORMAL LOW (ref 3.5–5.1)
Sodium: 138 mmol/L (ref 135–145)
Total Bilirubin: 0.9 mg/dL (ref 0.3–1.2)
Total Protein: 7 g/dL (ref 6.5–8.1)

## 2023-07-25 LAB — CBC WITH DIFFERENTIAL/PLATELET
Abs Immature Granulocytes: 0.01 10*3/uL (ref 0.00–0.07)
Basophils Absolute: 0 10*3/uL (ref 0.0–0.1)
Basophils Relative: 1 %
Eosinophils Absolute: 0.2 10*3/uL (ref 0.0–0.5)
Eosinophils Relative: 3 %
HCT: 46.5 % (ref 39.0–52.0)
Hemoglobin: 15.7 g/dL (ref 13.0–17.0)
Immature Granulocytes: 0 %
Lymphocytes Relative: 21 %
Lymphs Abs: 1.8 10*3/uL (ref 0.7–4.0)
MCH: 30.3 pg (ref 26.0–34.0)
MCHC: 33.8 g/dL (ref 30.0–36.0)
MCV: 89.8 fL (ref 80.0–100.0)
Monocytes Absolute: 0.6 10*3/uL (ref 0.1–1.0)
Monocytes Relative: 7 %
Neutro Abs: 6.1 10*3/uL (ref 1.7–7.7)
Neutrophils Relative %: 68 %
Platelets: 241 10*3/uL (ref 150–400)
RBC: 5.18 MIL/uL (ref 4.22–5.81)
RDW: 13.8 % (ref 11.5–15.5)
WBC: 8.7 10*3/uL (ref 4.0–10.5)
nRBC: 0 % (ref 0.0–0.2)

## 2023-07-25 LAB — ETHANOL: Alcohol, Ethyl (B): <10 mg/dL (ref <10)

## 2023-07-25 LAB — AMMONIA: Ammonia: 19 umol/L (ref 9–35)

## 2023-07-25 LAB — SALICYLATE LEVEL: Salicylate Lvl: <7 mg/dL — ABNORMAL LOW (ref 7.0–30.0)

## 2023-07-25 MED ORDER — POTASSIUM CHLORIDE CRYS ER 20 MEQ PO TBCR
40.0000 meq | EXTENDED_RELEASE_TABLET | Freq: Once | ORAL | Status: AC
  Administered 2023-07-25: 40 meq via ORAL
  Filled 2023-07-25: qty 2

## 2023-07-25 NOTE — ED Triage Notes (Signed)
Pt presents to ED from home with c/o hallucinations, pt says he isn't sure, thinks someone may be messing with him, denies drug use, says he has been hearing tapping on roof and kids laughing in background. No other sx reported. Pt alert and oriented x 4. Calm and cooperative.

## 2023-07-25 NOTE — ED Provider Notes (Signed)
[place] EMERGENCY DEPARTMENT AT [facility] Provider Note   CSN: [id number] Arrival date & time: 07/25/23  0133     History  Chief Complaint  Patient presents with   Hallucinations    Bradley Waller is a 52 y.o. male.  52 year old male without any recorded or reported psychiatric history or recent drug/alcohol use the presents ER today with a concern that people are following him and playing music to try to bother him.  He also thinks that there is people living under his trailer and people in the woods that are watching him with guns and are throwing rocks on top of his house.  States he has moved twice and they followed him.  He wants to make sure he does not have diabetes or something that could be causing this to happen but is feels like he is pretty sure that they want to steal his Mustang or his truck. No recent illnesses but is also worried about his liver causing problems. Says he's not seen the main person hat is upset with him but did see the people in the woods once.         Home Medications Prior to Admission medications   Medication Sig Start Date End Date Taking? Authorizing Provider  dicyclomine (BENTYL) 10 MG capsule Take 1 capsule (10 mg total) by mouth 4 (four) times daily -  before meals and at bedtime. 09/10/16   Gelene Mink, NP  oxyCODONE-acetaminophen (ROXICET) 5-325 MG tablet Take 1-2 tablets by mouth every 4 (four) hours as needed for severe pain. 10/01/16   Ancil Linsey, MD  pantoprazole (PROTONIX) 40 MG tablet Take 1 tablet (40 mg total) by mouth daily. 09/05/16   Gelene Mink, NP  Probiotic Product (PROBIOTIC DAILY PO) Take by mouth daily. Accuflora once a day    [provider]  Pseudoeph-Doxylamine-DM-APAP (NYQUIL PO) Take 30 mL/oz/day by mouth as needed (sleep).    [provider]  sucralfate (CARAFATE) 1 GM/10ML suspension Take 10 mLs (1 g total) by mouth 4 (four) times daily -  with meals and at bedtime. 09/05/16    Gelene Mink, NP      Allergies    Patient has no known allergies.    Review of Systems   Review of Systems  Physical Exam Updated Vital Signs BP 132/86 (BP Location: Left Arm)   Pulse 78   Temp 98.6 F (37 C) (Oral)   Resp 20   Ht 5\' 11"  (1.803 m)   Wt 84.4 kg   SpO2 98%   BMI 25.95 kg/m  Physical Exam Vitals and nursing note reviewed.  Constitutional:      Appearance: He is well-developed.  HENT:     Head: Normocephalic and atraumatic.  Cardiovascular:     Rate and Rhythm: Normal rate.  Pulmonary:     Effort: Pulmonary effort is normal. No respiratory distress.  Abdominal:     General: There is no distension.  Musculoskeletal:        General: Normal range of motion.     Cervical back: Normal range of motion.  Neurological:     Mental Status: He is alert.  Psychiatric:        Thought Content: Thought content is delusional.     ED Results / Procedures / Treatments   Labs (all labs ordered are listed, but only abnormal results are displayed) Labs Reviewed  COMPREHENSIVE METABOLIC PANEL - Abnormal; Notable for the following components:  Result Value   Potassium 3.0 (*)    Glucose, Bld 104 (*)    All other components within normal limits  SALICYLATE LEVEL - Abnormal; Notable for the following components:   Salicylate Lvl <7.0 (*)    All other components within normal limits  ETHANOL  CBC WITH DIFFERENTIAL/PLATELET  AMMONIA    EKG EKG Interpretation Date/Time:  Thursday July 25 2023 03:32:18 EDT Ventricular Rate:  91 PR Interval:  200 QRS Duration:  93 QT Interval:  350 QTC Calculation: 431 R Axis:   46  Text Interpretation: Sinus rhythm Borderline prolonged PR interval Abnormal R-wave progression, early transition Confirmed by Marily Memos [phone]) on 07/25/2023 4:09:05 AM  Radiology No results found.  Procedures Procedures    Medications Ordered in ED Medications  potassium chloride SA (KLOR-CON M) CR tablet 40 mEq (40 mEq Oral  Given 07/25/23 6045)    ED Course/ Medical Decision Making/ A&P                                 Medical Decision Making Amount and/or Complexity of Data Reviewed Labs: ordered.  Risk Prescription drug management.  Patient seems to be delusional. I think he knows it because he is asking for blood tests to make sure he doesn't have an illness that is causing these things to happen. It says a family member brought him in, will need to try and contact them for collateral. There is no alternate contacts to call in the computer.  Patient workup is ok. He doesn't want to wait longer for UA. He's stable. Still denying SI/HI. Understands this could be delusions and will follow up with [facility] if it gets worse. Doesn't seem to be a danger to self or others at this time and stable for discharge.   Final Clinical Impression(s) / ED Diagnoses Final diagnoses:  Delusions [facility])    Rx / DC Orders ED Discharge Orders     None         Roneka Gilpin, Barbara Cower, MD 07/26/23 0301

## 2023-08-04 ENCOUNTER — Emergency Department (HOSPITAL_COMMUNITY)
Admission: EM | Admit: 2023-08-04 | Discharge: 2023-08-05 | Disposition: A | Discharge status: Home / Self Care | Payer: Self-pay | Attending: Emergency Medicine | Admitting: Emergency Medicine

## 2023-08-04 ENCOUNTER — Other Ambulatory Visit: Payer: Self-pay

## 2023-08-04 ENCOUNTER — Encounter (HOSPITAL_COMMUNITY): Payer: Self-pay

## 2023-08-04 DIAGNOSIS — F22 Delusional disorders: Secondary | ICD-10-CM | POA: Insufficient documentation

## 2023-08-04 DIAGNOSIS — R000 Tachycardia, unspecified: Secondary | ICD-10-CM | POA: Insufficient documentation

## 2023-08-04 LAB — COMPREHENSIVE METABOLIC PANEL
ALT: 22 U/L (ref 0–44)
AST: 20 U/L (ref 15–41)
Albumin: 4.2 g/dL (ref 3.5–5.0)
Alkaline Phosphatase: 45 U/L (ref 38–126)
Anion gap: 10 (ref 5–15)
BUN: 7 mg/dL (ref 6–20)
CO2: 24 mmol/L (ref 22–32)
Calcium: 9 mg/dL (ref 8.9–10.3)
Chloride: 105 mmol/L (ref 98–111)
Creatinine, Ser: 0.9 mg/dL (ref 0.61–1.24)
GFR, Estimated: >60 mL/min (ref >60)
Glucose, Bld: 120 mg/dL — ABNORMAL HIGH (ref 70–99)
Potassium: 3.5 mmol/L (ref 3.5–5.1)
Sodium: 139 mmol/L (ref 135–145)
Total Bilirubin: 0.5 mg/dL (ref 0.3–1.2)
Total Protein: 7.2 g/dL (ref 6.5–8.1)

## 2023-08-04 LAB — CBC WITH DIFFERENTIAL/PLATELET
Abs Immature Granulocytes: 0.01 10*3/uL (ref 0.00–0.07)
Basophils Absolute: 0 10*3/uL (ref 0.0–0.1)
Basophils Relative: 0 %
Eosinophils Absolute: 0.1 10*3/uL (ref 0.0–0.5)
Eosinophils Relative: 2 %
HCT: 44.9 % (ref 39.0–52.0)
Hemoglobin: 15.1 g/dL (ref 13.0–17.0)
Immature Granulocytes: 0 %
Lymphocytes Relative: 24 %
Lymphs Abs: 1.7 10*3/uL (ref 0.7–4.0)
MCH: 30.5 pg (ref 26.0–34.0)
MCHC: 33.6 g/dL (ref 30.0–36.0)
MCV: 90.7 fL (ref 80.0–100.0)
Monocytes Absolute: 0.4 10*3/uL (ref 0.1–1.0)
Monocytes Relative: 6 %
Neutro Abs: 4.7 10*3/uL (ref 1.7–7.7)
Neutrophils Relative %: 68 %
Platelets: 297 10*3/uL (ref 150–400)
RBC: 4.95 MIL/uL (ref 4.22–5.81)
RDW: 13.6 % (ref 11.5–15.5)
WBC: 7 10*3/uL (ref 4.0–10.5)
nRBC: 0 % (ref 0.0–0.2)

## 2023-08-04 LAB — ACETAMINOPHEN LEVEL: Acetaminophen (Tylenol), Serum: <10 ug/mL — ABNORMAL LOW (ref 10–30)

## 2023-08-04 LAB — SALICYLATE LEVEL: Salicylate Lvl: <7 mg/dL — ABNORMAL LOW (ref 7.0–30.0)

## 2023-08-04 LAB — ETHANOL: Alcohol, Ethyl (B): <10 mg/dL (ref <10)

## 2023-08-04 MED ORDER — ALUM & MAG HYDROXIDE-SIMETH 200-200-20 MG/5ML PO SUSP
30.0000 mL | Freq: Once | ORAL | Status: AC
  Administered 2023-08-04: 30 mL via ORAL
  Filled 2023-08-04: qty 30

## 2023-08-04 MED ORDER — LIDOCAINE VISCOUS HCL 2 % MT SOLN
15.0000 mL | Freq: Once | OROMUCOSAL | Status: AC
  Administered 2023-08-04: 15 mL via ORAL
  Filled 2023-08-04: qty 15

## 2023-08-04 NOTE — ED Provider Notes (Signed)
[place] EMERGENCY DEPARTMENT AT [facility] Provider Note   CSN: [id number] Arrival date & time: 08/04/23  1549     History  Chief Complaint  Patient presents with   Psychiatric Evaluation    Bradley Waller is a 52 y.o. male past medical history significant for anxiety, alcohol abuse, drug abuse, acid reflux who presents from [facility] department with IVC paperwork for auditory hallucinations, people talking to each other, people living under his house.  He denies any alcohol or drug use.  He denies any SI, HI.  He endorses delusions of someone stalking him, shooting BB gun pellet guns at his house.  HPI     Home Medications Prior to Admission medications   Medication Sig Start Date End Date Taking? Authorizing Provider  Cyanocobalamin (VITAMIN B-12 PO) Take 1 tablet by mouth daily.   Yes [provider]  Ibuprofen-diphenhydrAMINE Cit (ADVIL PM PO) Take 2 tablets by mouth at bedtime as needed (sleep).   Yes [provider]      Allergies    Patient has no known allergies.    Review of Systems   Review of Systems  All other systems reviewed and are negative.   Physical Exam Updated Vital Signs BP (!) 153/111 (BP Location: Right Arm)   Pulse (!) 114   Temp 98.1 F (36.7 C)   Resp 17   Ht 5\' 11"  (1.803 m)   Wt 84.4 kg   SpO2 100%   BMI 25.94 kg/m  Physical Exam Vitals and nursing note reviewed.  Constitutional:      General: He is not in acute distress.    Appearance: Normal appearance.  HENT:     Head: Normocephalic and atraumatic.  Eyes:     General:        Right eye: No discharge.        Left eye: No discharge.  Cardiovascular:     Rate and Rhythm: Regular rhythm. Tachycardia present.     Heart sounds: No murmur heard.    No friction rub. No gallop.  Pulmonary:     Effort: Pulmonary effort is normal.     Breath sounds: Normal breath sounds.  Abdominal:     General: Bowel sounds are normal.     Palpations: Abdomen is  soft.  Skin:    General: Skin is warm and dry.     Capillary Refill: Capillary refill takes less than 2 seconds.  Neurological:     Mental Status: He is alert and oriented to person, place, and time.  Psychiatric:     Comments: Patient does exhibit delusions, but his speech is not rapid, nonpressured.  He denies any SI, HI.  He reports that other people cannot see what he is saying but he is convinced of the reality of his delusions.  Denies any plan to act on delusions.     ED Results / Procedures / Treatments   Labs (all labs ordered are listed, but only abnormal results are displayed) Labs Reviewed  COMPREHENSIVE METABOLIC PANEL - Abnormal; Notable for the following components:      Result Value   Glucose, Bld 120 (*)    All other components within normal limits  SALICYLATE LEVEL - Abnormal; Notable for the following components:   Salicylate Lvl <7.0 (*)    All other components within normal limits  ACETAMINOPHEN LEVEL - Abnormal; Notable for the following components:   Acetaminophen (Tylenol), Serum <10 (*)    All other components within normal limits  ETHANOL  CBC WITH DIFFERENTIAL/PLATELET  RAPID URINE DRUG SCREEN, HOSP PERFORMED  URINALYSIS, ROUTINE W REFLEX MICROSCOPIC    EKG None  Radiology No results found.  Procedures Procedures    Medications Ordered in ED Medications - No data to display  ED Course/ Medical Decision Making/ A&P                                 Medical Decision Making Amount and/or Complexity of Data Reviewed Labs: ordered.   Patient is a 52 y.o. male  who presents to the emergency department for psychiatric complaint.  Past Medical History: anxiety, alcohol abuse, drug abuse, acid reflux  Physical Exam:  Patient does exhibit delusions, but his speech is not rapid, nonpressured.  He denies any SI, HI.  He reports that other people cannot see what he is saying but he is convinced of the reality of his delusions.  Denies any plan to  act on delusions.   Some tachycardia with normal rhythm on initial evaluation resolved on repeat evaluation.  Labs: Medical clearance labs ordered, with following pertinent results: Mildly elevated glucose at 120, otherwise unremarkable.  Disposition: Patient is otherwise medically cleared at this time pending medical clearance laboratory evaluation. Will consult TTS and appreciate their recommendations.  Patient brought in with IVC paperwork.  I had a discussion with the patient and I do not know that he needs to be involuntarily committed at this time.  I discussed with him that I would like him to be evaluated by our psych team prior to making a decision on involuntary commitment and he agrees to stay for psychiatric evaluation.  Attempted to obtain collateral from mother who did not answer her phone.  At this time he is medically cleared for psychiatric eval.  I discussed this case with my attending physician Dr. Lawrence Marseilles who cosigned this note including patient's presenting symptoms, physical exam, and planned diagnostics and interventions. Attending physician stated agreement with plan or made changes to plan which were implemented.   Final Clinical Impression(s) / ED Diagnoses Final diagnoses:  None    Rx / DC Orders ED Discharge Orders     None         Olene Floss, PA-C 08/04/23 1755    Pricilla Loveless, MD 08/12/23 [phone]

## 2023-08-04 NOTE — ED Triage Notes (Signed)
BIB [facility] with IVC paperwork for Auditory hallucinations of people talking to each other and states people are living under his house and out in the woods with mask on x 4 months.  Denies ETOH and drug use.  Pt states "someone is stalking me and playing music to loud and shooting bb gun and pellet guns at my house. I have to take benadryl to help me sleep."  Denies SI/HI

## 2023-08-04 NOTE — BH Assessment (Signed)
Contacted [facility] to request tele-psychiatry consult. Created secure conversation including Dr Pricilla Loveless, Luther Hearing, PA-C, Thornton Papas, Paramedic, and [employer].   Pamalee Leyden, [facility], [facility] Triage Specialist

## 2023-08-05 DIAGNOSIS — F22 Delusional disorders: Secondary | ICD-10-CM

## 2023-08-05 NOTE — ED Notes (Signed)
Patient given personal belongings.

## 2023-08-05 NOTE — ED Notes (Signed)
Tts complete

## 2023-08-05 NOTE — Consult Note (Signed)
[facility] Consult Note  Patient Name: Bradley Waller MRN: [id number] DOB: Mar 23, 1971 DATE OF Consult: 08/05/2023  PRIMARY PSYCHIATRIC DIAGNOSES  1.  Delusional disorder 2.  Rule out unspecified psychosis 3.  Rule out substance induced psychosis   RECOMMENDATIONS  Recommendations: Medication recommendations: None at this time Non-Medication/therapeutic recommendations: -Resources for outpatient therapy and psychiatry; crisis line information; ED return precautions Is inpatient psychiatric hospitalization recommended for this patient? No (Explain why): Denies suicidal and homicidal ideation, has viable plan for self care Follow-Up Telepsychiatry C/L services: We will sign off for now. Please re-consult our service if needed for any concerning changes in the patient's condition, discharge planning, or questions. Communication: Treatment team members (and family members if applicable) who were involved in treatment/care discussions and planning, and with whom we spoke or engaged with via secure text/chat, include the following: Dr. Pilar Plate; Ephriam Knuckles; Ala Dach   Thank you for involving [place] in the care of this patient. If you have any additional questions or concerns, please call [phone] and ask for me or the provider on-call.  TELEPSYCHIATRY ATTESTATION & CONSENT  As the provider for this telehealth consult, I attest that I verified the patient's identity using two separate identifiers, introduced myself to the patient, provided my credentials, disclosed my location, and performed this encounter via a HIPAA-compliant, real-time, face-to-face, two-way, interactive audio and video platform and with the full consent and agreement of the patient (or guardian as applicable.)  Patient physical location: ED in [facility] hospital  Telehealth provider physical location: home office in state of [place]   Video start time: 0028 AM EST Video end time: 0043 AM EST   IDENTIFYING DATA  Bradley Waller is a 52 y.o. year-old male for whom a psychiatric consultation has been ordered by the primary provider. The patient was identified using two separate identifiers.  CHIEF COMPLAINT/REASON FOR CONSULT  Delusions  HISTORY OF PRESENT ILLNESS (HPI)  Bradley Waller is a 52 year old male with a history of anxiety, alcohol and opioid use disorders who presents to the ED on IVC due to delusions that people are living under his house and shooting a bb gun at his house. Seen in the ED 9/26 for similar and discharged. Ethanol negative, no UDS resulted. Psychiatry consulted for disposition recommendations.   On evaluation, patient noted to be calm, cooperative, normoverbal, euthymic, linear, not appearing internally preoccupied, not responding to internal stimuli, alert and oriented x 4. Patient reports his mother called the police "she thinks I have a problem with hearing voices." States he wanted to sleep at her house because he hears things under his house and she called the police on him instead. He states, "she doesn't want to believe me." States that he has a "problem with people messing with me up under my house and shooting a bb gun at me." He reports he hears the people under his house talking and "irritating the crap out me." He states this began 3 weeks ago. He states it only happens at nighttime. He states he believes his neighbors are doing this because he called the sheriff on them for playing loud music. He states the neighbors started bothering him immediately after he called the sheriff. Patient reports he sleeps 5-6 hours on some nights when the people under his house bother him and sometimes sleeps 8-9 hours per night. Patient endorses access to firearms. Denies suicidal and homicidal ideation. Denies thoughts to retaliate against his neighbors who he believes are harassing him. Denies depressed  mood other depressive symptoms, denies symptoms consistent with mania/hypomania, homicidal ideation.  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 Patient reports he has not used alcohol or pain pills for 5-6 years. He states he had a cholecystectomy 5 years ago and did not drinks or use opioids since. Denies the use of other drugs. Patient reports his mom is supportive and he has a friend Bradley Waller who is supportive. Patient reports he came to the ED himself on 9/26 and wanted to get checked out to get away from the people he believes are harassing him. Denies he has ever had this experience before. Patient states he has a referral to a psychiatrist and is open to reaching the,.    PAST PSYCHIATRIC HISTORY  Current psych meds: Denies  Prior psych meds: Diazepam, alprazolam  Outpatient: Denies currently  Inpatient: Denies Non-suicidal self injury: Denies Suicide attempts: Denies Violence: Denies Trauma/neglect/abuse: Denies Drugs/alcohol: History of alcohol and opioid use disorders, no use for 5-6 years  Otherwise as per HPI above.  PAST MEDICAL HISTORY  Past Medical History:  Diagnosis Date   Alcohol abuse    Anxiety    Drug abuse (HCC)    GERD (gastroesophageal reflux disease)      HOME MEDICATIONS  Facility Ordered Medications  Medication   [COMPLETED] alum & mag hydroxide-simeth (MAALOX/MYLANTA) 200-200-20 MG/5ML suspension 30 mL   And   [COMPLETED] lidocaine (XYLOCAINE) 2 % viscous mouth solution 15 mL    ALLERGIES  No Known Allergies  SOCIAL & SUBSTANCE USE HISTORY  Social History   Socioeconomic History   Marital status: Single    Spouse name: Not on file   Number of children: Not on file   Years of education: Not on file   Highest education level: Not on file  Occupational History   Not on file  Tobacco Use   Smoking status: Every Day    Current packs/day: 1.00    Types: Cigarettes   Smokeless tobacco: Never  Substance and Sexual Activity   Alcohol use: No    Comment: history of ETOH abuse but none in  5-6 years.    Drug use: No    Comment: was on prescription Valium, off for about 1 year.    Sexual activity: Yes  Other Topics Concern   Not on file  Social History Narrative   Not on file   Social Determinants of Health   Financial Resource Strain: Not on file  Food Insecurity: Not on file  Transportation Needs: Not on file  Physical Activity: Not on file  Stress: Not on file  Social Connections: Not on file   Social History   Tobacco Use  Smoking Status Every Day   Current packs/day: 1.00   Types: Cigarettes  Smokeless Tobacco Never   Social History   Substance and Sexual Activity  Alcohol Use No   Comment: history of ETOH abuse but none in 5-6 years.    Social History   Substance and Sexual Activity  Drug Use No   Comment: was on prescription Valium, off for about 1 year.    No alcohol or opioid use in 5-6 years   FAMILY HISTORY  Family History  Problem Relation Age of Onset   Colon cancer Neg Hx    Family Psychiatric History (if known):  Denies    MENTAL STATUS EXAM (MSE)  Presentation  General Appearance:  Appropriate for Environment  Eye Contact:  Good  Speech: Normal Rate  Speech Volume: Normal   Mood and Affect  Mood: Euthymic  Affect: Appropriate   Thought Process  Thought Processes: Linear  Descriptions of Associations: Intact  Orientation: Full (Time, Place and Person)  Thought Content: Delusions  History of Schizophrenia/Schizoaffective disorder: No  Duration of Psychotic Symptoms: Less than six months  Hallucinations: Yes   Suicidal Thoughts: Suicidal Thoughts: No  Homicidal Thoughts: Homicidal Thoughts: No   Sensorium  Memory: Immediate Good; Recent Good; Remote Good  Judgment: Good  Insight: Limited    Executive Functions  Concentration: Good  Attention Span: Good  Recall: Good  Fund of Knowledge: Good  Language: Good   Psychomotor Activity  Psychomotor Activity: Psychomotor  Activity: Normal   Assets  Assets: Communication Skills    VITALS  Blood pressure (!) 153/111, pulse (!) 114, temperature 98.1 F (36.7 C), resp. rate 17, height 5\' 11"  (1.803 m), weight 84.4 kg, SpO2 100%.  LABS  Admission on 08/04/2023  Component Date Value Ref Range Status   Sodium 08/04/2023 139  135 - 145 mmol/L Final   Potassium 08/04/2023 3.5  3.5 - 5.1 mmol/L Final   Chloride 08/04/2023 105  98 - 111 mmol/L Final   CO2 08/04/2023 24  22 - 32 mmol/L Final   Glucose, Bld 08/04/2023 120 (H)  70 - 99 mg/dL Final   Glucose reference range applies only to samples taken after fasting for at least 8 hours.   BUN 08/04/2023 7  6 - 20 mg/dL Final   Creatinine, Ser 08/04/2023 0.90  0.61 - 1.24 mg/dL Final   Calcium 16/07/9603 9.0  8.9 - 10.3 mg/dL Final   Total Protein 54/06/8118 7.2  6.5 - 8.1 g/dL Final   Albumin 14/78/2956 4.2  3.5 - 5.0 g/dL Final   AST 21/30/8657 20  15 - 41 U/L Final   ALT 08/04/2023 22  0 - 44 U/L Final   Alkaline Phosphatase 08/04/2023 45  38 - 126 U/L Final   Total Bilirubin 08/04/2023 0.5  0.3 - 1.2 mg/dL Final   GFR, Estimated 08/04/2023 >60  >60 mL/min Final   Comment: (NOTE) Calculated using the CKD-EPI Creatinine Equation (2021)    Anion gap 08/04/2023 10  5 - 15 Final   Performed at [facility], [address], [place], [place] [address]   Alcohol, Ethyl (B) 08/04/2023 <10  <10 mg/dL Final   Comment: (NOTE) Lowest detectable limit for serum alcohol is 10 mg/dL.  For medical purposes only. Performed at [facility], [address], [place], [place] [address]    WBC 08/04/2023 7.0  4.0 - 10.5 K/uL Final   RBC 08/04/2023 4.95  4.22 - 5.81 MIL/uL Final   Hemoglobin 08/04/2023 15.1  13.0 - 17.0 g/dL Final   HCT 41/32/4401 44.9  39.0 - 52.0 % Final   MCV 08/04/2023 90.7  80.0 - 100.0 fL Final   MCH 08/04/2023 30.5  26.0 - 34.0 pg Final   MCHC 08/04/2023 33.6  30.0 - 36.0 g/dL Final   RDW 02/72/5366 13.6  11.5 - 15.5 % Final   Platelets  08/04/2023 297  150 - 400 K/uL Final   nRBC 08/04/2023 0.0  0.0 - 0.2 % Final   Neutrophils Relative % 08/04/2023 68  % Final   Neutro Abs 08/04/2023 4.7  1.7 - 7.7 K/uL Final   Lymphocytes Relative 08/04/2023 24  % Final   Lymphs Abs 08/04/2023 1.7  0.7 - 4.0 K/uL Final   Monocytes Relative 08/04/2023 6  %  Final   Monocytes Absolute 08/04/2023 0.4  0.1 - 1.0 K/uL Final   Eosinophils Relative 08/04/2023 2  % Final   Eosinophils Absolute 08/04/2023 0.1  0.0 - 0.5 K/uL Final   Basophils Relative 08/04/2023 0  % Final   Basophils Absolute 08/04/2023 0.0  0.0 - 0.1 K/uL Final   Immature Granulocytes 08/04/2023 0  % Final   Abs Immature Granulocytes 08/04/2023 0.01  0.00 - 0.07 K/uL Final   Performed at [facility], [address] [place], [place] [address]   Salicylate Lvl 08/04/2023 <7.0 (L)  7.0 - 30.0 mg/dL Final   Performed at [facility], [address], [place], [place] [address]   Acetaminophen (Tylenol), Serum 08/04/2023 <10 (L)  10 - 30 ug/mL Final   Comment: (NOTE) Therapeutic concentrations vary significantly. A range of 10-30 ug/mL  may be an effective concentration for many patients. However, some  are best treated at concentrations outside of this range. Acetaminophen concentrations >150 ug/mL at 4 hours after ingestion  and >50 ug/mL at 12 hours after ingestion are often associated with  toxic reactions.  Performed at [facility], [address] [place], [place] [address]     PSYCHIATRIC REVIEW OF SYSTEMS (ROS)  ROS: Notable for the following relevant positive findings: Review of Systems  Psychiatric/Behavioral:  The patient has insomnia.     Additional findings:      Musculoskeletal: No abnormal movements observed      Gait & Station: Normal      Pain Screening: Denies      Nutrition & Dental Concerns: None   RISK FORMULATION/ASSESSMENT  Is the patient experiencing any suicidal or homicidal ideations: No   Protective factors considered for safety management:  Family, social support, future oriented, identifies reasons to live, history of managing stress without engaging in suicidal behavior, willingness to engage in outpatient mental health treatment  Risk factors/concerns considered for safety management:  Substance abuse/dependence Male gender Unmarried  Is there a safety management plan with the patient and treatment team to minimize risk factors and promote protective factors: Yes           Explain: -Resources for outpatient therapy and psychiatry; crisis line information; ED return precautions  Is crisis care placement or psychiatric hospitalization recommended: Yes     Based on my current evaluation and risk assessment, patient is determined at this time to be at:  Low risk  *RISK ASSESSMENT Risk assessment is a dynamic process; it is possible that this patient's condition, and risk level, may change. This should be re-evaluated and managed over time as appropriate. Please re-consult psychiatric consult services if additional assistance is needed in terms of risk assessment and management. If your team decides to discharge this patient, please advise the patient how to best access emergency psychiatric services, or to call 911, if their condition worsens or they feel unsafe in any way.  Bradley Waller is a 52 year old male with a history of anxiety, alcohol and opioid use disorders who presents to the ED on IVC due to delusions that people are living under his house and shooting a bb gun at his house. Seen in the ED 9/26 for similar and discharged. Ethanol negative, no UDS resulted. Psychiatry consulted for disposition recommendations. On evaluation, patient noted to be calm, cooperative, normoverbal, euthymic, linear, not appearing internally preoccupied, not responding to internal stimuli, alert and oriented x 4. Patient reports 3 weeks ago he called the sheriff on his neighbors for playing loud music.  He endorses since that time his neighbors  have been crawling under his house at night talking and shooting a bb gun at his house. States he does not hear them except when he is home at night. He continues to be able to function in his life, going to work, has social support, taking care of himself otherwise. Reports some difficulty sleeping, some nights sleeps 5-6 hours per night, some nights 8-9 hours. Denies symptoms consistent with mania/hypomania, depression, denies suicidal and homicidal ideation. The patient's presentation is consistent with delusional disorder, with isolated, fixed false belief but ability to maintain functioning in other areas of his life and precipitated by a stressor and without thought disorganization, disorganized behavior, hallucinations outside of the context of the fixed delusion, rule out unspecified psychosis and substance induced psychosis. The patient denies suicidal and homicidal ideation. The patient is currently low risk for harm to self and others due to protective factors including family, social support, future oriented, identifies reasons to live, history of managing stress without engaging in suicidal behavior, willingness to engage in outpatient mental health treatment. Therefore, inpatient psychiatric hospitalization is not recommended.    Adria Dill, MD Telepsychiatry Consult Services

## 2023-08-05 NOTE — Discharge Instructions (Addendum)
You were evaluated in the Emergency Department and after careful evaluation, we did not find any emergent condition requiring admission or further testing in the hospital.  Your exam/testing today is overall reassuring.  Recommend follow-up with behavioral health.  Please return to the Emergency Department if you experience any worsening of your condition.   Thank you for allowing [place] to be a part of your care.

## 2023-08-05 NOTE — ED Notes (Signed)
Patient in tts

## 2023-08-05 NOTE — ED Provider Notes (Signed)
  Provider Note MRN:  [id number]  Arrival date & time: 08/05/23    ED Course and Medical Decision Making  Assumed care from default provider.  Called by psychiatry, patient has fixed delusions but does not qualify for inpatient management.  Psychiatrically cleared.  Patient is also medically cleared, appropriate for discharge.  Procedures  Final Clinical Impressions(s) / ED Diagnoses     ICD-10-CM   1. Delusions (HCC)  F22       ED Discharge Orders     None         Discharge Instructions      You were evaluated in the Emergency Department and after careful evaluation, we did not find any emergent condition requiring admission or further testing in the hospital.  Your exam/testing today is overall reassuring.  Recommend follow-up with behavioral health.  Please return to the Emergency Department if you experience any worsening of your condition.   Thank you for allowing [place] to be a part of your care.    Elmer Sow. Pilar Plate, MD [facility] Emergency [facility] [email or web address]    Sabas Sous, MD 08/05/23 [phone]

## 2023-12-25 ENCOUNTER — Emergency Department (HOSPITAL_COMMUNITY)
Admission: EM | Admit: 2023-12-25 | Discharge: 2023-12-25 | Discharge status: Against Medical Advice | Payer: Self-pay | Source: Home / Self Care

## 2024-03-02 ENCOUNTER — Ambulatory Visit (HOSPITAL_COMMUNITY)
Admission: EM | Admit: 2024-03-02 | Discharge: 2024-03-02 | Disposition: A | Discharge status: Home / Self Care | Payer: Self-pay | Attending: Family Medicine | Admitting: Family Medicine

## 2024-03-02 ENCOUNTER — Encounter (HOSPITAL_COMMUNITY): Payer: Self-pay

## 2024-03-02 DIAGNOSIS — Z711 Person with feared health complaint in whom no diagnosis is made: Secondary | ICD-10-CM

## 2024-03-02 NOTE — ED Triage Notes (Signed)
 Pt states put iodine in his lt ear 2 months ago. States feels like putty. Denies pain.

## 2024-03-02 NOTE — Discharge Instructions (Addendum)
  1. Worried well (Primary) - Advised to continue monitoring symptoms for any change in severity if there is any escalation of current symptoms or development of new symptoms follow-up for further evaluation management. - Advised patient if he feels like there is wax or other substance in the ear to flush ear with warm water while showering to remove obstruction.

## 2024-03-02 NOTE — ED Provider Notes (Signed)
 [facility]  Note:  This document was prepared using Dragon voice recognition software and may include unintentional dictation errors.  MRN: [id number] DOB: [birth year]  Subjective:   Bradley Waller is a 53 y.o. male presenting for inspection of ear due to feeling of fullness.  Patient reports that he put iodine in his ear 2 months ago and believes it is still in there.  Patient denies any pain or drainage from ear.  States he thought he had an infection so he put iodine in his ear to kill the infection.  No fever, tinnitus, cough, nasal congestion, sore throat.  No current facility-administered medications for this encounter.  Current Outpatient Medications:    Cyanocobalamin (VITAMIN Waller-12 PO), Take 1 tablet by mouth daily., Disp: , Rfl:    Ibuprofen-diphenhydrAMINE Cit (ADVIL PM PO), Take 2 tablets by mouth at bedtime as needed (sleep)., Disp: , Rfl:    No Known Allergies  Past Medical History:  Diagnosis Date   Alcohol abuse    Anxiety    Drug abuse (HCC)    GERD (gastroesophageal reflux disease)      Past Surgical History:  Procedure Laterality Date   CHOLECYSTECTOMY N/A 10/01/2016   Procedure: LAPAROSCOPIC CHOLECYSTECTOMY;  Surgeon: Franki Isles, MD;  Location: [facility];  Service: General;  Laterality: N/A;   MULTIPLE TOOTH EXTRACTIONS Bilateral 2008    Family History  Problem Relation Age of Onset   Colon cancer Neg Hx     Social History   Tobacco Use   Smoking status: Every Day    Current packs/day: 1.00    Types: Cigarettes   Smokeless tobacco: Never  Substance Use Topics   Alcohol use: No    Comment: history of ETOH abuse but none in 5-6 years.    Drug use: No    Comment: was on prescription Valium, off for about 1 year.     ROS Refer to HPI for ROS details.  Objective:   Vitals: BP (!) 191/117 (BP Location: Right Arm)   Pulse 87   Temp 98.2 F (36.8 C) (Oral)   Resp 18   SpO2 95%   Physical Exam Vitals and nursing note  reviewed.  Constitutional:      General: He is not in acute distress.    Appearance: He is well-developed. He is not ill-appearing or toxic-appearing.  HENT:     Head: Normocephalic.     Right Ear: Tympanic membrane, ear canal and external ear normal.     Left Ear: Tympanic membrane, ear canal and external ear normal. Decreased hearing noted.  No middle ear effusion. There is no impacted cerumen. No foreign body. Tympanic membrane is not injected, erythematous or bulging.  Cardiovascular:     Rate and Rhythm: Normal rate.  Pulmonary:     Effort: Pulmonary effort is normal. No respiratory distress.  Skin:    General: Skin is warm and dry.  Neurological:     General: No focal deficit present.     Mental Status: He is alert and oriented to person, place, and time.  Psychiatric:        Mood and Affect: Mood normal.        Behavior: Behavior normal.     Procedures  No results found for this or any previous visit (from the past 24 hours).  No results found.   Assessment and Plan :     Discharge Instructions       1. Worried well (Primary) - Advised to continue  monitoring symptoms for any change in severity if there is any escalation of current symptoms or development of new symptoms follow-up for further evaluation management. - Advised patient if he feels like there is wax or other substance in the ear to flush ear with warm water while showering to remove obstruction.       Bradley Waller Waller Bradley Waller   Bradley Waller, Bradley Waller, [place] 03/02/24 2019
